# Patient Record
Sex: Female | Born: 1950 | Race: Black or African American | Hispanic: No | Marital: Married | Smoking: Never smoker
Health system: Southern US, Community
[De-identification: ages and names within clinical notes are randomized; demographics above are authoritative.]

## PROBLEM LIST (undated history)

## (undated) DIAGNOSIS — E7800 Pure hypercholesterolemia, unspecified: Secondary | ICD-10-CM

## (undated) DIAGNOSIS — I10 Essential (primary) hypertension: Secondary | ICD-10-CM

## (undated) DIAGNOSIS — M8580 Other specified disorders of bone density and structure, unspecified site: Secondary | ICD-10-CM

## (undated) HISTORY — PX: NECK SURGERY: SHX720

## (undated) HISTORY — DX: Pure hypercholesterolemia, unspecified: E78.00

## (undated) HISTORY — DX: Essential (primary) hypertension: I10

## (undated) HISTORY — PX: ABDOMINAL HYSTERECTOMY: SHX81

## (undated) HISTORY — PX: KNEE ARTHROSCOPY: SUR90

## (undated) HISTORY — PX: DILATION AND CURETTAGE OF UTERUS: SHX78

## (undated) HISTORY — DX: Other specified disorders of bone density and structure, unspecified site: M85.80

---

## 1998-10-31 ENCOUNTER — Other Ambulatory Visit: Admission: RE | Admit: 1998-10-31 | Discharge: 1998-10-31 | Payer: Self-pay | Admitting: Gynecology

## 1999-09-04 ENCOUNTER — Inpatient Hospital Stay (HOSPITAL_COMMUNITY): Admission: RE | Admit: 1999-09-04 | Discharge: 1999-09-06 | Payer: Self-pay | Admitting: Neurosurgery

## 1999-09-04 ENCOUNTER — Encounter: Payer: Self-pay | Admitting: Neurosurgery

## 1999-10-02 ENCOUNTER — Encounter: Admission: RE | Admit: 1999-10-02 | Discharge: 1999-10-02 | Payer: Self-pay | Admitting: Neurosurgery

## 1999-10-02 ENCOUNTER — Encounter: Payer: Self-pay | Admitting: Neurosurgery

## 1999-11-13 ENCOUNTER — Ambulatory Visit (HOSPITAL_COMMUNITY): Admission: RE | Admit: 1999-11-13 | Discharge: 1999-11-13 | Payer: Self-pay | Admitting: Neurosurgery

## 1999-11-13 ENCOUNTER — Encounter: Payer: Self-pay | Admitting: Neurosurgery

## 1999-12-19 ENCOUNTER — Other Ambulatory Visit: Admission: RE | Admit: 1999-12-19 | Discharge: 1999-12-19 | Payer: Self-pay | Admitting: Gynecology

## 2001-02-26 ENCOUNTER — Other Ambulatory Visit: Admission: RE | Admit: 2001-02-26 | Discharge: 2001-02-26 | Payer: Self-pay | Admitting: Gynecology

## 2002-03-01 ENCOUNTER — Other Ambulatory Visit: Admission: RE | Admit: 2002-03-01 | Discharge: 2002-03-01 | Payer: Self-pay | Admitting: Gynecology

## 2002-10-31 ENCOUNTER — Ambulatory Visit (HOSPITAL_COMMUNITY): Admission: RE | Admit: 2002-10-31 | Discharge: 2002-10-31 | Payer: Self-pay | Admitting: Gastroenterology

## 2003-05-01 ENCOUNTER — Other Ambulatory Visit: Admission: RE | Admit: 2003-05-01 | Discharge: 2003-05-01 | Payer: Self-pay | Admitting: Gynecology

## 2004-06-21 ENCOUNTER — Other Ambulatory Visit: Admission: RE | Admit: 2004-06-21 | Discharge: 2004-06-21 | Payer: Self-pay | Admitting: Gynecology

## 2005-06-24 ENCOUNTER — Other Ambulatory Visit: Admission: RE | Admit: 2005-06-24 | Discharge: 2005-06-24 | Payer: Self-pay | Admitting: Gynecology

## 2006-06-26 ENCOUNTER — Other Ambulatory Visit: Admission: RE | Admit: 2006-06-26 | Discharge: 2006-06-26 | Payer: Self-pay | Admitting: Gynecology

## 2007-07-13 ENCOUNTER — Other Ambulatory Visit: Admission: RE | Admit: 2007-07-13 | Discharge: 2007-07-13 | Payer: Self-pay | Admitting: Gynecology

## 2007-08-03 ENCOUNTER — Encounter: Admission: RE | Admit: 2007-08-03 | Discharge: 2007-08-03 | Payer: Self-pay | Admitting: Family Medicine

## 2007-12-27 ENCOUNTER — Encounter: Admission: RE | Admit: 2007-12-27 | Discharge: 2007-12-27 | Payer: Self-pay | Admitting: Orthopedic Surgery

## 2008-01-06 ENCOUNTER — Ambulatory Visit (HOSPITAL_BASED_OUTPATIENT_CLINIC_OR_DEPARTMENT_OTHER): Admission: RE | Admit: 2008-01-06 | Discharge: 2008-01-06 | Payer: Self-pay | Admitting: Orthopedic Surgery

## 2008-07-21 ENCOUNTER — Other Ambulatory Visit: Admission: RE | Admit: 2008-07-21 | Discharge: 2008-07-21 | Payer: Self-pay | Admitting: Gynecology

## 2008-07-21 ENCOUNTER — Ambulatory Visit: Payer: Self-pay | Admitting: Gynecology

## 2008-07-21 ENCOUNTER — Encounter: Payer: Self-pay | Admitting: Gynecology

## 2009-07-10 ENCOUNTER — Ambulatory Visit: Payer: Self-pay | Admitting: Gynecology

## 2009-07-18 ENCOUNTER — Ambulatory Visit: Payer: Self-pay | Admitting: Gynecology

## 2009-07-25 ENCOUNTER — Ambulatory Visit: Payer: Self-pay | Admitting: Gynecology

## 2009-07-25 ENCOUNTER — Other Ambulatory Visit: Admission: RE | Admit: 2009-07-25 | Discharge: 2009-07-25 | Payer: Self-pay | Admitting: Gynecology

## 2009-08-06 ENCOUNTER — Ambulatory Visit: Payer: Self-pay | Admitting: Gynecology

## 2009-10-19 ENCOUNTER — Ambulatory Visit: Payer: Self-pay | Admitting: Gynecology

## 2010-02-19 ENCOUNTER — Ambulatory Visit: Payer: Self-pay | Admitting: Gynecology

## 2010-06-19 ENCOUNTER — Ambulatory Visit
Admission: RE | Admit: 2010-06-19 | Discharge: 2010-06-19 | Payer: Self-pay | Source: Home / Self Care | Attending: Orthopedic Surgery | Admitting: Orthopedic Surgery

## 2010-07-26 ENCOUNTER — Encounter: Payer: Self-pay | Admitting: Gynecology

## 2010-08-22 ENCOUNTER — Encounter (INDEPENDENT_AMBULATORY_CARE_PROVIDER_SITE_OTHER): Payer: 59 | Admitting: Gynecology

## 2010-08-22 ENCOUNTER — Other Ambulatory Visit (HOSPITAL_COMMUNITY)
Admission: RE | Admit: 2010-08-22 | Discharge: 2010-08-22 | Disposition: A | Discharge status: Home / Self Care | Payer: 59 | Source: Ambulatory Visit | Attending: Gynecology | Admitting: Gynecology

## 2010-08-22 ENCOUNTER — Other Ambulatory Visit: Payer: Self-pay | Admitting: Gynecology

## 2010-08-22 DIAGNOSIS — M810 Age-related osteoporosis without current pathological fracture: Secondary | ICD-10-CM

## 2010-08-22 DIAGNOSIS — Z124 Encounter for screening for malignant neoplasm of cervix: Secondary | ICD-10-CM | POA: Insufficient documentation

## 2010-08-22 DIAGNOSIS — Z01419 Encounter for gynecological examination (general) (routine) without abnormal findings: Secondary | ICD-10-CM

## 2010-08-27 ENCOUNTER — Encounter (INDEPENDENT_AMBULATORY_CARE_PROVIDER_SITE_OTHER): Payer: 59

## 2010-08-27 DIAGNOSIS — Z1211 Encounter for screening for malignant neoplasm of colon: Secondary | ICD-10-CM

## 2010-08-27 DIAGNOSIS — M810 Age-related osteoporosis without current pathological fracture: Secondary | ICD-10-CM

## 2010-09-02 LAB — POCT I-STAT 4, (NA,K, GLUC, HGB,HCT)
Glucose, Bld: 83 mg/dL (ref 70–99)
HCT: 46 % (ref 36.0–46.0)
Potassium: 3.6 mEq/L (ref 3.5–5.1)

## 2010-11-05 NOTE — Op Note (Signed)
Janet Shelton, Janet Shelton NO.:  [id number]   MEDICAL RECORD NO.:  [id number]          PATIENT TYPE:  AMB   LOCATION:  DSC                          FACILITY:  [facility]   PHYSICIAN:  Rodney A. Mortenson, M.D.DATE OF BIRTH:  Oct 12, 1950   DATE OF PROCEDURE:  01/06/2008  DATE OF DISCHARGE:                               OPERATIVE REPORT   REFERRING PHYSICIAN:  Jethro Bastos, MD   JUSTIFICATION:  This is a 60 year old female complained of left knee  pain since February 2009.  Referred here by Dr. Marny Lowenstein.  No  injury or pain felt to the anterior aspect of the knee.  Getting down  the stairs is quite painful.  There is a small effusion.  No significant  medial and lateral joint line tenderness.  An MRI of the knee was done  and this shows extensive undersurface tearing of the posterior horn of  the medial meniscus and posterior medial horn of the medial meniscus  with a small free edge fragment and suspected free edge tear of the  midportion of the lateral meniscus.  Because of persistent pain and  discomfort, failing conservative care, the patient now admitted for  arthroscopic evaluation and treatment.  Questions were answered and  encouraged preoperatively.  Complications discussed.   JUSTIFICATION OUTPATIENT SURGICAL:  Minimal morbidity.   PREOPERATIVE DIAGNOSES:  Tear of posterior horn medial meniscal of left  knee; tear leading edge lateral meniscus left knee.   POSTOPERATIVE DIAGNOSES:  Complex tear posterior horn of the medial  meniscus of left knee; leading edge tear lateral meniscus anterior-third  and mid-third lateral meniscus left knee.   OPERATION:  Partial medial and lateral meniscectomy, left knee.   SURGEON:  Lenard Galloway. Chaney Malling, MD   ANESTHESIA:  MAC.   PATHOLOGY:  With the arthroscope, a very careful examination was  undertaken.  The articular cartilage and posterior aspect of the patella  appeared absent now.  In the femoral trochlear  area, there was a fissure  where cartilage was torn off.  The anterior cruciate ligament was  absolutely normal.  With the arthroscope in the lateral compartment,  there was tearing of the leading edge of the lateral meniscus over the  anterior-third, mid-third, and the posterior-third was normal.  No  articular cartilage loss off the femoral condyle or tibial plateau.  In  the medial compartment, there was an extensive undersurface of the  posterior horn of the medial meniscus and this was a complex tear of the  various fissure lines in different directions.  No damage to the  articular cartilage over the femoral condyle or tibial plateau was seen.   PROCEDURE:  The patient was placed on the operating table in supine  position with the pneumatic tourniquet about the left upper thigh.  The  entire left lower extremity was prepped with DuraPrep and draped down in  the usual manner.  An infusion can was placed in the superior medial  pouch and the knee distended with saline.  Anteromedial and  anterolateral portals were made.  The arthroscope was introduced.  The  findings were  as described above.  Attention first turned to the lateral  compartment.  Using the intra-articular shaver, the leading edge of the  lateral meniscus over anterior-third and mid-third was debrided back.  A  majority of the width of the meniscus was preserved and the frayed and  torn portions were debrided very nicely.  Once this was accomplished to  my satisfaction, the arthroscope was then placed in the medial  compartment.  The tear of the posterior horn of the medial meniscus was  much more complicated and required debridement through both medial and  lateral portals with series of various size baskets.  Once this was  debulked, intra-articular shaver was introduced.  The remaining rim was  then smoothed and balanced with nice transition of the mid-third of the  medial meniscus.  All debris was removed.  Excellent  decompression was  achieved in this area.  Knee was then filled with Marcaine.  A large  bulky pressure dressing applied.  The patient returned to recovery room  in excellent condition.  Technically, this went extremely well.   DISPOSITION:  1. To my office on Wednesday.  2. Usual postoperative instructions were given.  3. Percocet for pain.      Rodney A. Chaney Malling, M.D.  Electronically Signed     RAM/MEDQ  D:  01/06/2008  T:  01/06/2008  Job:  [id number]

## 2010-11-08 NOTE — Op Note (Signed)
[place]. [facility]  Patient:    Janet Shelton, Janet Shelton                      MRN: [id number] Proc. Date: 09/04/99 Adm. Date:  [id number] Attending:  Jackelyn Knife                           Operative Report  PREOPERATIVE DIAGNOSIS:  Herniated cervical disk C6-C7.  POSTOPERATIVE DIAGNOSIS:  Herniated cervical disk C6-C7.  OPERATION PERFORMED:  Anterior cervical diskectomy and fusion C6-C7.  SURGEON:  Izell Swain. Elesa Hacker, M.D.  ASSISTANT:  Clydene Fake, M.D.  INDICATIONS:  The patient is a 60 year old female who has been followed since February because of neck and right upper extremity pain. Conservative measures ere tried, but these did not help her pain. She had a significant right triceps weakness. An MRI study demonstrated a rather large C6-C7 disk herniation central on the right side. Because of a lack of improvement with conservative measures, she was admitted for the procedure described below.  PROCEDURE:  Under general endotracheal anesthesia, this lady was positioned supine on the operating table with the head in halter traction. The anterior portion of the neck was prepped, the patient draped in the usual manner. A preliminary cross table lateral film was made with a metallic marker in place to better localize he ideal position for the incision. After this was done, an incision was made on the left side of the neck extending from the midline over obliquely to the anterior  border of the sternocleidomastoid. Dissection was carried down through subcutaneous tissue to a rather thin platysmal layer which was also incised. Strap muscles were identified. Using primarily blunt dissection, the anterior vertebral body and disk margins were identified. A second cross table lateral film was taken with a metallic marker in the intervertebral disk space which shall prove to be the C6-C7 space. After positive identification of the proper operative  level, several retainer retractors were put into place as were intervertebral disk space spreaders. The soft disk material was removed all of the way down to the posterior longitudinal ligament and on the right side, as expected, herniated disk fragments were identified and also removed. The posterior longitudinal ligament was excised and its extent and care was taken to be sure that the nerve root on the right at this level was decompressed. The wound was copiously irrigated. A bone graft was formed from allograft iliac crest and shaped and then put into the intervertebral disk space. The traction was removed and the intervertebral disk space spreader  likewise was released. A 24-mm Blackstone anterior cervical plate was then put nto position and implanted with four 14 mm screws. A final cross table lateral x-ray was taken to assure proper location of the bone graft, screws, and plate.  The wound was copiously irrigated with antibiotic solution. Bleeding points were electrocoagulated and the wound was then closed in layers. A sterile dressing was applied and a soft cervical collar put into place. The patient was to taken to he recovery room in good condition. DD:  09/04/99 TD:  09/04/99 Job: [id number]

## 2010-11-08 NOTE — Discharge Summary (Signed)
[place]. [facility]  Patient:    Janet Shelton, Janet Shelton                      MRN: [id number] Adm. Date:  [id number] Disc. Date: [id number] Attending:  Jackelyn Knife                           Discharge Summary  SUMMARY:  Ms. Janet Shelton is a generally healthy 60 year old female, who presented with a several-month history of neck and right upper extremity pain.  This worsened in spite of conservative therapy.  A cervical MRI demonstrated a herniated intervertebral disk at C6-C7 on the right side.  After an appropriate preoperative evaluation as well as an explanation of the goals and risks involved, she was taken to the operating room on September 04, 1999.  She  underwent an anterior cervical diskectomy and fusion at C6-C7 using allograft and also application of an anterior cervical plate.  She tolerated the procedure well, but her hospitalization was prolonged by a day because of postoperative nausea.  She went on to be discharged on September 06, 1999, up and about and doing well. Her incision was healing well.  FINAL DIAGNOSES:  1. Herniated cervical disk, C6-C7.  2. Postoperative nausea.  CONDITION ON DISCHARGE:  Improving.  INSTRUCTIONS TO PATIENT:  1. Up ad lib with a 10-pound weight lifting restriction for four weeks.  She is to     wear a soft cervical collar for four weeks.  2. Regular diet.  3. Back to see me in six days.  4. Discharge medications:  Ibuprofen. DD:  09/06/99 TD:  09/06/99 Job: [id number]

## 2010-11-08 NOTE — Op Note (Signed)
   NAME:  Janet Shelton, Janet Shelton                         ACCOUNT NO.:  [id number]   MEDICAL RECORD NO.:  [id number]                   PATIENT TYPE:  AMB   LOCATION:  ENDO                                 FACILITY:  [facility]   PHYSICIAN:  Anselmo Rod, M.D.               DATE OF BIRTH:  1951-02-28   DATE OF PROCEDURE:  10/31/2002  DATE OF DISCHARGE:                                 OPERATIVE REPORT   PROCEDURE:  Colonoscopy.   ENDOSCOPIST:  Anselmo Rod, M.D.   INSTRUMENT USED:  Olympus video colonoscope.   INDICATION FOR PROCEDURE:  Change in bowel habits in a 60 year old African-  American female.  Rule out colonic polyps, masses, etc.   PREPROCEDURE PREPARATION:  Informed consent was procured from the patient.  The patient had fasted for eight hours prior to the procedure and prepped  with a bottle of magnesium citrate and a gallon of GoLYTELY the night prior  to the procedure.   PREPROCEDURE PHYSICAL:  VITAL SIGNS:  The patient had stable vital signs.  NECK:  Supple.  CHEST:  Clear to auscultation.  S1, S2 regular.  ABDOMEN:  Soft with normal bowel sounds.   DESCRIPTION OF PROCEDURE:  The patient was placed in the left lateral  decubitus position and sedated with 60 mg of Demerol and 6 mg of Versed  intravenously.  Once the patient was adequately sedate and maintained on low-  flow oxygen and continuous cardiac monitoring, the Olympus video colonoscope  was advanced from the rectum to the cecum without difficulty.  There was  some residual stool in the colon, and multiple washes were done.  Small  lesions could have been missed.  No masses, polyps, erosions, ulcerations,  or diverticula were seen.  Retroflexion in the rectum revealed no  abnormalities.   IMPRESSION:  Normal colonoscopy.  No masses or polyps seen.    RECOMMENDATIONS:  1. A high-fiber diet with 20-25 g of fiber in the diet and liberal fluid     intake has been recommended.  2. Outpatient follow-up in the next  two weeks for further recommendations.                                               Anselmo Rod, M.D.    JNM/MEDQ  D:  10/31/2002  T:  11/01/2002  Job:  [id number]   cc:   Gaetano Hawthorne. Lily Peer, M.D.  [address]  [place]  [place] [address]  Fax: [phone]   Jethro Bastos, M.D.  [address]  [place]  [place] [address]  Fax: [phone]

## 2010-11-08 NOTE — H&P (Signed)
[place]. [facility]  Patient:    Janet Shelton, Janet Shelton.                     MRN: Adm. Date:  09/04/99 Attending:  Izell Rochelle. Elesa Hacker, M.D.                         History and Physical  HISTORY:  Ms. Brailee Riede is a 60 year old female who was seen initially in mid-February 2001.  She was seen through the courtesy of Dr. Thereasa Distance A. Mortenson.  Mrs. Wilbanks developed neck and right upper extremity pain about a month prior to the February 12th visit.  Pain radiated from the neck, across the arm and shoulder, on down into the distal portion of the extremity.  She does not recall any injury or unusual activity that tended to cause this problem.  After the onset of her pain, she had two weeks of physical therapy, along with analgesics and anti-inflammatories but did not get significant benefit from this; she also tried a TENS unit along with a heating pad and had some minimal benefit from these measures.  She was given a trial of cortisone injection to the right shoulder area and to the upper thoracic and lower cervical musculature but this did not provide significant relief.  An MRI study was done and this showed a disc herniation at C6-C7 on the right side, consistent with her symptoms.  She has had no trouble with her left arm, nor has she had involvement of her legs or bowel and bladder.  PAST MEDICAL HISTORY:  She has had some high blood pressure.  She has had no problems with stroke or diabetes.  FAMILY HISTORY:  Her mother is living at age 41 and in good health.  Her father  died in his late 34s with cirrhosis of the liver.  She has one brother in his 7s, living and well, and two sisters, ages 49 and 72, living and well.  She has no children.  PREVIOUS SURGERY:  Her previous surgery consists of a hysterectomy in 1997.  CURRENT MEDICATIONS:  Maxzide, Baycol, Premarin, Celebrex and Vicodin.  ALLERGIES:  She is allergic to [facility].  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  She does not smoke or use alcohol.  She is married.  REVIEW OF SYSTEMS:  Review of systems is negative, except as already noted in the history of the present illness.  PHYSICAL EXAMINATION:  GENERAL:  On examination, she is alert and quite pleasant.  She weighs 165 pounds and is 5 feet 5 inches tall.  VITAL SIGNS:  Blood pressure is 146/98 with a pulse of 109 and normal temperature.  HEENT:  Examination is normal.  NECK:  She has no cervical adenopathy or mass lesions, nor does she have cervical bruits.  CHEST:  Clear bilaterally.  CARDIAC:  No murmurs.  ABDOMEN:  Soft and nontender, with no palpable masses.  NEUROLOGIC:  Examination reveals a limited range of motion of her cervical spine, particularly with rotation of the head and neck to the right side, which increases her neck and arm pain.  A motor examination reveals significant weakness of the  right triceps, with excellent strength elsewhere.  Deep tendon reflexes are roughly symmetrical and this includes the right triceps reflex at this point.  She has ome minimal hypesthesia over the index finger on the right.  Gait and coordination  re normal and deep tendon reflexes in the lower extremities are normal.  IMPRESSION:  Herniated cervical disc at C6-C7 on the right side.  Mrs. Mehlman was seen back in the office on March 5th and a careful explanation f the proposed anterior cervical diskectomy and fusion with plating and allograft was carried out.  She understands the risks include bleeding, infection, damage to carotid artery, potential damage to the esophagus or trachea, potential damage o vertebral artery, spinal cord or nerve root.  She understands that damage to neural structures can produce weakness, paralysis and sensory loss.  She understands damage to the arteries could produce a stroke.  We will proceed later  on today with an anterior decompression and fusion.DD: 09/04/99 TD:  09/04/99 Job: [id number]

## 2011-02-19 ENCOUNTER — Other Ambulatory Visit: Payer: Self-pay | Admitting: *Deleted

## 2011-02-20 ENCOUNTER — Telehealth: Payer: Self-pay | Admitting: *Deleted

## 2011-02-20 MED ORDER — DENOSUMAB 60 MG/ML ~~LOC~~ SOLN
60.0000 mg | Freq: Once | SUBCUTANEOUS | Status: AC
  Administered 2011-02-26: 60 mg via SUBCUTANEOUS

## 2011-02-20 NOTE — Telephone Encounter (Signed)
Patient informed Prolia here $25 copay will schedule for next week.

## 2011-02-26 ENCOUNTER — Ambulatory Visit (INDEPENDENT_AMBULATORY_CARE_PROVIDER_SITE_OTHER): Payer: 59 | Admitting: Anesthesiology

## 2011-02-26 DIAGNOSIS — M810 Age-related osteoporosis without current pathological fracture: Secondary | ICD-10-CM

## 2011-03-20 LAB — BASIC METABOLIC PANEL
CO2: 26
Calcium: 9.5
Creatinine, Ser: 0.73
GFR calc Af Amer: >60
GFR calc non Af Amer: >60
Sodium: 140

## 2011-08-11 ENCOUNTER — Encounter: Payer: Self-pay | Admitting: Gynecology

## 2011-08-26 ENCOUNTER — Encounter: Payer: PRIVATE HEALTH INSURANCE | Admitting: Gynecology

## 2011-09-02 ENCOUNTER — Telehealth: Payer: Self-pay | Admitting: *Deleted

## 2011-09-02 NOTE — Telephone Encounter (Signed)
Lm for patient to call.  Checking again with patient on insurance information.  Both insurances claiming to be primary.  Could not check benefits on Prolia.

## 2011-09-03 NOTE — Telephone Encounter (Signed)
Patient said insurance still working on it, will let me know when ready to reprocess.

## 2011-09-16 NOTE — Telephone Encounter (Signed)
Insurance corrected, called prolia and they will reprocess.

## 2011-09-25 ENCOUNTER — Ambulatory Visit (INDEPENDENT_AMBULATORY_CARE_PROVIDER_SITE_OTHER): Payer: PRIVATE HEALTH INSURANCE | Admitting: Gynecology

## 2011-09-25 ENCOUNTER — Encounter: Payer: Self-pay | Admitting: Gynecology

## 2011-09-25 VITALS — BP 120/86 | Ht 63.5 in | Wt 170.0 lb

## 2011-09-25 DIAGNOSIS — I10 Essential (primary) hypertension: Secondary | ICD-10-CM

## 2011-09-25 DIAGNOSIS — M810 Age-related osteoporosis without current pathological fracture: Secondary | ICD-10-CM

## 2011-09-25 DIAGNOSIS — E7800 Pure hypercholesterolemia, unspecified: Secondary | ICD-10-CM

## 2011-09-25 DIAGNOSIS — Z01419 Encounter for gynecological examination (general) (routine) without abnormal findings: Secondary | ICD-10-CM

## 2011-09-25 DIAGNOSIS — E559 Vitamin D deficiency, unspecified: Secondary | ICD-10-CM

## 2011-09-25 LAB — CREATININE, SERUM: Creat: 0.82 mg/dL (ref 0.50–1.10)

## 2011-09-25 LAB — CALCIUM: Calcium: 10.2 mg/dL (ref 8.4–10.5)

## 2011-09-25 LAB — BUN: BUN: 9 mg/dL (ref 6–23)

## 2011-09-25 NOTE — Patient Instructions (Signed)
Janet Shelton, I would like for you to start taking one Caltrate plus tablet daily along with your vitamin D 3 (Cholecalciferol) 2000 units. If you vitamin D level as low on today's blood tests when we call you he will need to stop this vitamin D 3 daily and take the 1 to were going to prescribe for you like she had before that she will take weekly for 12 weeks to get her level back to normal before you go back to the daily lower dose. Continue to exercise 3 or 4 times a week. We'll have any check for your Prolia injection. Remember to schedule her colonoscopy as well and to send this and the female the fecal occult blood testing cards.

## 2011-09-25 NOTE — Progress Notes (Signed)
Janet Shelton January 05, 1951 [id number]   History:    61 y.o.  for annual exam with no complaints today. Review of her record indicated she has a prior history of total abdominal hysterectomy with bilateral salpingo-oophorectomy. She is on no hormone replacement therapy and is having vasomotor symptoms. She was diagnosed with osteoporosis in 2011 and was started on Prolia Q6 months and is due for her next injection in the next few weeks. She's been followed by the [facility] family medicine group who is been monitoring her hypertension and hypercholesterolemia. Patient has had history vitamin D deficiency in the past and has not been taking her calcium and vitamin D on regular basis. Her last colonoscopy was 10 years ago. And her last mammogram was February this year which was normal.  Past medical history,surgical history, family history and social history were all reviewed and documented in the EPIC chart.  Gynecologic History No LMP recorded. Patient has had a hysterectomy. Contraception: none Last Pap: 2012. Results were: normal Last mammogram: 2013. Results were: normal  Obstetric History OB History    Grav Para Term Preterm Abortions TAB SAB Ect Mult Living   1 0        0     # Outc Date GA Lbr Len/2nd Wgt Sex Del Anes PTL Lv   1 GRA                ROS:  Was performed and pertinent positives and negatives are included in the history.  Exam: chaperone present  BP 120/86  Ht 5' 3.5" (1.613 m)  Wt 170 lb (77.111 kg)  BMI 29.64 kg/m2  Body mass index is 29.64 kg/(m^2).  General appearance : Well developed well nourished female. No acute distress HEENT: Neck supple, trachea midline, no carotid bruits, no thyroidmegaly Lungs: Clear to auscultation, no rhonchi or wheezes, or rib retractions  Heart: Regular rate and rhythm, no murmurs or gallops Breast:Examined in sitting and supine position were symmetrical in appearance, no palpable masses or tenderness,  no skin retraction, no nipple  inversion, no nipple discharge, no skin discoloration, no axillary or supraclavicular lymphadenopathy Abdomen: no palpable masses or tenderness, no rebound or guarding Extremities: no edema or skin discoloration or tenderness  Pelvic:  Bartholin, Urethra, Skene Glands: Within normal limits             Vagina: No gross lesions or discharge  Cervix: Absent  Uterus  absent  Adnexa  Without masses or tenderness  Anus and perineum  normal   Rectovaginal  normal sphincter tone without palpated masses or tenderness             Hemoccult fecal occult blood testing cards given to patient is made to the office for testing   Bone density study done last 08/27/2010 after one year of having been on Prolia indicated a 6.5% improvement on the AP spine T score of -3.5 the remainder of the regions of interest were normal range.  Assessment/Plan:  61 y.o. female for annual exam with history of osteoporosis has been on Prolia since February 2011. Next Prolia injection scheduled the next few weeks. She will stop by the lab we'll check her BUN and creatinine along with calcium and vitamin D level. We discussed the new screening guidelines her Pap smears she has never had abnormal Pap smears has had a hysterectomy so no Pap smears will be done from her on. She is exercising on a regular basis. We encouraged her to take her Caltrate plus  daily along with a vitamin D 3 cholecalciferol 2000 units daily. Fecal occult blood testing cards provided for her to submit to the office for testing. She was reminded to schedule colonoscopy which is due. And she was instructed to continue her monthly self breast examinations.    Ok Edwards MD, 12:32 PM 09/25/2011

## 2011-09-26 ENCOUNTER — Encounter: Payer: Self-pay | Admitting: *Deleted

## 2011-09-26 ENCOUNTER — Other Ambulatory Visit: Payer: Self-pay | Admitting: *Deleted

## 2011-09-30 ENCOUNTER — Other Ambulatory Visit: Payer: Self-pay | Admitting: *Deleted

## 2011-09-30 DIAGNOSIS — M810 Age-related osteoporosis without current pathological fracture: Secondary | ICD-10-CM

## 2011-09-30 MED ORDER — DENOSUMAB 60 MG/ML ~~LOC~~ SOLN
60.0000 mg | Freq: Once | SUBCUTANEOUS | Status: AC
  Administered 2011-10-06: 60 mg via SUBCUTANEOUS

## 2011-09-30 NOTE — Telephone Encounter (Signed)
Patient informed covered at 100% and have ordered Prolia and will come for injection

## 2011-10-06 ENCOUNTER — Telehealth: Payer: Self-pay | Admitting: *Deleted

## 2011-10-06 ENCOUNTER — Ambulatory Visit (INDEPENDENT_AMBULATORY_CARE_PROVIDER_SITE_OTHER): Payer: PRIVATE HEALTH INSURANCE

## 2011-10-06 DIAGNOSIS — M810 Age-related osteoporosis without current pathological fracture: Secondary | ICD-10-CM

## 2011-10-06 NOTE — Telephone Encounter (Signed)
Message copied by Aura Camps on Mon Oct 06, 2011 12:53 PM ------      Message from: Leanor Kail      Created: Mon Oct 06, 2011  9:56 AM                   Patient, had her labs done at last appointment on 09/25/11 & would like a call with the results.            [phone]

## 2011-10-06 NOTE — Telephone Encounter (Signed)
Pt informed with normal lab result on 09/25/11.

## 2012-03-24 ENCOUNTER — Telehealth: Payer: Self-pay | Admitting: *Deleted

## 2012-03-24 NOTE — Telephone Encounter (Signed)
Informed patient and she will call me back after the 14th to schedule appt. Benefits are same as earlier this year, pt pays nothing since has secondary insurance. KW

## 2012-03-24 NOTE — Telephone Encounter (Signed)
LM for pt regarding Prolia. KW

## 2012-04-13 NOTE — Telephone Encounter (Signed)
Lm for pt to call me back. KW 

## 2012-04-14 ENCOUNTER — Ambulatory Visit (INDEPENDENT_AMBULATORY_CARE_PROVIDER_SITE_OTHER): Payer: PRIVATE HEALTH INSURANCE | Admitting: *Deleted

## 2012-04-14 DIAGNOSIS — M810 Age-related osteoporosis without current pathological fracture: Secondary | ICD-10-CM

## 2012-04-14 MED ORDER — DENOSUMAB 60 MG/ML ~~LOC~~ SOLN
60.0000 mg | Freq: Once | SUBCUTANEOUS | Status: AC
  Administered 2012-04-14: 60 mg via SUBCUTANEOUS

## 2012-04-14 NOTE — Telephone Encounter (Signed)
Called and made an appt for Prolia for today. KW

## 2012-09-21 ENCOUNTER — Encounter: Payer: Self-pay | Admitting: Gynecology

## 2012-11-29 ENCOUNTER — Encounter: Payer: Self-pay | Admitting: Gynecology

## 2012-12-01 ENCOUNTER — Ambulatory Visit (INDEPENDENT_AMBULATORY_CARE_PROVIDER_SITE_OTHER): Payer: PRIVATE HEALTH INSURANCE | Admitting: Gynecology

## 2012-12-01 ENCOUNTER — Encounter: Payer: Self-pay | Admitting: Gynecology

## 2012-12-01 VITALS — BP 124/80 | Ht 64.5 in | Wt 175.0 lb

## 2012-12-01 DIAGNOSIS — Z8639 Personal history of other endocrine, nutritional and metabolic disease: Secondary | ICD-10-CM

## 2012-12-01 DIAGNOSIS — M810 Age-related osteoporosis without current pathological fracture: Secondary | ICD-10-CM

## 2012-12-01 DIAGNOSIS — R232 Flushing: Secondary | ICD-10-CM

## 2012-12-01 DIAGNOSIS — Z01419 Encounter for gynecological examination (general) (routine) without abnormal findings: Secondary | ICD-10-CM

## 2012-12-01 DIAGNOSIS — N951 Menopausal and female climacteric states: Secondary | ICD-10-CM

## 2012-12-01 LAB — CREATININE, SERUM: Creat: 1.12 mg/dL — ABNORMAL HIGH (ref 0.50–1.10)

## 2012-12-01 MED ORDER — PAROXETINE MESYLATE 7.5 MG PO CAPS: 7.5000 mg | ORAL_CAPSULE | Freq: Every day | ORAL | Status: DC

## 2012-12-01 NOTE — Patient Instructions (Signed)
Shingles Shingles (herpes zoster) is an infection that is caused by the same virus that causes chickenpox (varicella). The infection causes a painful skin rash and fluid-filled blisters, which eventually break open, crust over, and heal. It may occur in any area of the body, but it usually affects only one side of the body or face. The pain of shingles usually lasts about 1 month. However, some people with shingles may develop long-term (chronic) pain in the affected area of the body. Shingles often occurs many years after the person had chickenpox. It is more common:  In people older than 50 years.  In people with weakened immune systems, such as those with HIV, AIDS, or cancer.  In people taking medicines that weaken the immune system, such as transplant medicines.  In people under great stress. CAUSES  Shingles is caused by the varicella zoster virus (VZV), which also causes chickenpox. After a person is infected with the virus, it can remain in the person's body for years in an inactive state (dormant). To cause shingles, the virus reactivates and breaks out as an infection in a nerve root. The virus can be spread from person to person (contagious) through contact with open blisters of the shingles rash. It will only spread to people who have not had chickenpox. When these people are exposed to the virus, they may develop chickenpox. They will not develop shingles. Once the blisters scab over, the person is no longer contagious and cannot spread the virus to others. SYMPTOMS  Shingles shows up in stages. The initial symptoms may be pain, itching, and tingling in an area of the skin. This pain is usually described as burning, stabbing, or throbbing.In a few days or weeks, a painful red rash will appear in the area where the pain, itching, and tingling were felt. The rash is usually on one side of the body in a band or belt-like pattern. Then, the rash usually turns into fluid-filled blisters. They  will scab over and dry up in approximately 2 3 weeks. Flu-like symptoms may also occur with the initial symptoms, the rash, or the blisters. These may include:  Fever.  Chills.  Headache.  Upset stomach. DIAGNOSIS  Your caregiver will perform a skin exam to diagnose shingles. Skin scrapings or fluid samples may also be taken from the blisters. This sample will be examined under a microscope or sent to a lab for further testing. TREATMENT  There is no specific cure for shingles. Your caregiver will likely prescribe medicines to help you manage the pain, recover faster, and avoid long-term problems. This may include antiviral drugs, anti-inflammatory drugs, and pain medicines. HOME CARE INSTRUCTIONS   Take a cool bath or apply cool compresses to the area of the rash or blisters as directed. This may help with the pain and itching.   Only take over-the-counter or prescription medicines as directed by your caregiver.   Rest as directed by your caregiver.  Keep your rash and blisters clean with mild soap and cool water or as directed by your caregiver.  Do not pick your blisters or scratch your rash. Apply an anti-itch cream or numbing creams to the affected area as directed by your caregiver.  Keep your shingles rash covered with a loose bandage (dressing).  Avoid skin contact with:  Babies.   Pregnant women.   Children with eczema.   Elderly people with transplants.   People with chronic illnesses, such as leukemia or AIDS.   Wear loose-fitting clothing to help ease   the pain of material rubbing against the rash.  Keep all follow-up appointments with your caregiver.If the area involved is on your face, you may receive a referral for follow-up to a specialist, such as an eye doctor (ophthalmologist) or an ear, nose, and throat (ENT) doctor. Keeping all follow-up appointments will help you avoid eye complications, chronic pain, or disability.  SEEK IMMEDIATE MEDICAL  CARE IF:   You have facial pain, pain around the eye area, or loss of feeling on one side of your face.  You have ear pain or ringing in your ear.  You have loss of taste.  Your pain is not relieved with prescribed medicines.   Your redness or swelling spreads.   You have more pain and swelling.  Your condition is worsening or has changed.   You have a feveror persistent symptoms for more than 2 3 days.  You have a fever and your symptoms suddenly get worse. MAKE SURE YOU:  Understand these instructions.  Will watch your condition.  Will get help right away if you are not doing well or get worse. Document Released: 06/09/2005 Document Revised: 03/03/2012 Document Reviewed: 01/22/2012 [facility] Patient Information 2014 [place], [place]. Tetanus, Diphtheria, Pertussis (Tdap) Vaccine What You Need to Know WHY GET VACCINATED? Tetanus, diphtheria and pertussis can be very serious diseases, even for adolescents and adults. Tdap vaccine can protect [place] from these diseases. TETANUS (Lockjaw) causes painful muscle tightening and stiffness, usually all over the body.  It can lead to tightening of muscles in the head and neck so you can't open your mouth, swallow, or sometimes even breathe. Tetanus kills about 1 out of 5 people who are infected. DIPHTHERIA can cause a thick coating to form in the back of the throat.  It can lead to breathing problems, paralysis, heart failure, and death. PERTUSSIS (Whooping Cough) causes severe coughing spells, which can cause difficulty breathing, vomiting and disturbed sleep.  It can also lead to weight loss, incontinence, and rib fractures. Up to 2 in 100 adolescents and 5 in 100 adults with pertussis are hospitalized or have complications, which could include pneumonia and death. These diseases are caused by bacteria. Diphtheria and pertussis are spread from person to person through coughing or sneezing. Tetanus enters the body through cuts,  scratches, or wounds. Before vaccines, the [place] saw as many as 200,000 cases a year of diphtheria and pertussis, and hundreds of cases of tetanus. Since vaccination began, tetanus and diphtheria have dropped by about 99% and pertussis by about 80%. TDAP VACCINE Tdap vaccine can protect adolescents and adults from tetanus, diphtheria, and pertussis. One dose of Tdap is routinely given at age 64 or 32. People who did not get Tdap at that age should get it as soon as possible. Tdap is especially important for health care professionals and anyone having close contact with a baby younger than 12 months. Pregnant women should get a dose of Tdap during every pregnancy, to protect the newborn from pertussis. Infants are most at risk for severe, life-threatening complications from pertussis. A similar vaccine, called Td, protects from tetanus and diphtheria, but not pertussis. A Td booster should be given every 10 years. Tdap may be given as one of these boosters if you have not already gotten a dose. Tdap may also be given after a severe cut or burn to prevent tetanus infection. Your doctor can give you more information. Tdap may safely be given at the same time as other vaccines. SOME PEOPLE SHOULD NOT  GET THIS VACCINE  If you ever had a life-threatening allergic reaction after a dose of any tetanus, diphtheria, or pertussis containing vaccine, OR if you have a severe allergy to any part of this vaccine, you should not get Tdap. Tell your doctor if you have any severe allergies.  If you had a coma, or long or multiple seizures within 7 days after a childhood dose of DTP or DTaP, you should not get Tdap, unless a cause other than the vaccine was found. You can still get Td.  Talk to your doctor if you:  have epilepsy or another nervous system problem,  had severe pain or swelling after any vaccine containing diphtheria, tetanus or pertussis,  ever had Guillain-Barr Syndrome (GBS),  aren't  feeling well on the day the shot is scheduled. RISKS OF A VACCINE REACTION With any medicine, including vaccines, there is a chance of side effects. These are usually mild and go away on their own, but serious reactions are also possible. Brief fainting spells can follow a vaccination, leading to injuries from falling. Sitting or lying down for about 15 minutes can help prevent these. Tell your doctor if you feel dizzy or light-headed, or have vision changes or ringing in the ears. Mild problems following Tdap (Did not interfere with activities)  Pain where the shot was given (about 3 in 4 adolescents or 2 in 3 adults)  Redness or swelling where the shot was given (about 1 person in 5)  Mild fever of at least 100.84F (up to about 1 in 25 adolescents or 1 in 100 adults)  Headache (about 3 or 4 people in 10)  Tiredness (about 1 person in 3 or 4)  Nausea, vomiting, diarrhea, stomach ache (up to 1 in 4 adolescents or 1 in 10 adults)  Chills, body aches, sore joints, rash, swollen glands (uncommon) Moderate problems following Tdap (Interfered with activities, but did not require medical attention)  Pain where the shot was given (about 1 in 5 adolescents or 1 in 100 adults)  Redness or swelling where the shot was given (up to about 1 in 16 adolescents or 1 in 25 adults)  Fever over 102F (about 1 in 100 adolescents or 1 in 250 adults)  Headache (about 3 in 20 adolescents or 1 in 10 adults)  Nausea, vomiting, diarrhea, stomach ache (up to 1 or 3 people in 100)  Swelling of the entire arm where the shot was given (up to about 3 in 100). Severe problems following Tdap (Unable to perform usual activities, required medical attention)  Swelling, severe pain, bleeding and redness in the arm where the shot was given (rare). A severe allergic reaction could occur after any vaccine (estimated less than 1 in a million doses). WHAT IF THERE IS A SERIOUS REACTION? What should I look for?  Look  for anything that concerns you, such as signs of a severe allergic reaction, very high fever, or behavior changes. Signs of a severe allergic reaction can include hives, swelling of the face and throat, difficulty breathing, a fast heartbeat, dizziness, and weakness. These would start a few minutes to a few hours after the vaccination. What should I do?  If you think it is a severe allergic reaction or other emergency that can't wait, call 9-1-1 or get the person to the nearest hospital. Otherwise, call your doctor.  Afterward, the reaction should be reported to the "Vaccine Adverse Event Reporting System" (VAERS). Your doctor might file this report, or you can do it  yourself through the VAERS web site at www.vaers.LAgents.no, or by calling 1-417-036-3759. VAERS is only for reporting reactions. They do not give medical advice.  THE NATIONAL VACCINE INJURY COMPENSATION PROGRAM The National Vaccine Injury Compensation Program (VICP) is a federal program that was created to compensate people who may have been injured by certain vaccines. Persons who believe they may have been injured by a vaccine can learn about the program and about filing a claim by calling 1-(478)089-3607 or visiting the VICP website at SpiritualWord.at. HOW CAN I LEARN MORE?  Ask your doctor.  Call your local or state health department.  Contact the Centers for Disease Control and Prevention (CDC):  Call 224-256-6154 or visit CDC's website at PicCapture.uy. CDC Tdap Vaccine VIS (10/30/11) Document Released: 12/09/2011 Document Revised: 03/03/2012 Document Reviewed: 12/09/2011 [employer] Patient Information 2014 [place], [place].

## 2012-12-02 NOTE — Progress Notes (Signed)
Janet Shelton 02-Feb-1951 [id number]   History:    62 y.o.  for annual gyn who is complaining of vasomotor symptoms. Review of her record indicated she has a prior history of total abdominal hysterectomy with bilateral salpingo-oophorectomy. She was diagnosed with osteoporosis in 2011 and was started on Prolia Q6 months and is due for her next injection. She's been followed by the [facility] family medicine group who is been monitoring her hypertension and hypercholesterolemia. Patient has had history vitamin D deficiency in the past and has not been taking her calcium and vitamin D on regular basis . Her colonoscopy was in 2013 which was reported to be normal. Her last bone density study was in 2012 one year after having initiated Prolia was as follows: Patient has statistically improved her bone mineral density of her AP spine by 6.5% whose previous T score was -3.9 and was -3.5 in 2004. Total left hip was stable total and right hip there were significant improvement in her BMD of 6.7%.   Past medical history,surgical history, family history and social history were all reviewed and documented in the EPIC chart.  Gynecologic History No LMP recorded. Patient has had a hysterectomy. Contraception:postmenopausal Last Pap: 2012. Results were: normal Last mammogram: 2014. Results were: normal  Obstetric History OB History   Grav Para Term Preterm Abortions TAB SAB Ect Mult Living   1 0        0     # Outc Date GA Lbr Len/2nd Wgt Sex Del Anes PTL Lv   1 GRA                ROS: A ROS was performed and pertinent positives and negatives are included in the history.  GENERAL: No fevers or chills. HEENT: No change in vision, no earache, sore throat or sinus congestion. NECK: No pain or stiffness. CARDIOVASCULAR: No chest pain or pressure. No palpitations. PULMONARY: No shortness of breath, cough or wheeze. GASTROINTESTINAL: No abdominal pain, nausea, vomiting or diarrhea, melena or bright red blood per  rectum. GENITOURINARY: No urinary frequency, urgency, hesitancy or dysuria. MUSCULOSKELETAL: No joint or muscle pain, no back pain, no recent trauma. DERMATOLOGIC: No rash, no itching, no lesions. ENDOCRINE: No polyuria, polydipsia, no heat or cold intolerance. No recent change in weight. HEMATOLOGICAL: No anemia or easy bruising or bleeding. NEUROLOGIC: No headache, seizures, numbness, tingling or weakness. PSYCHIATRIC: No depression, no loss of interest in normal activity or change in sleep pattern.     Exam: chaperone present  BP 124/80  Ht 5' 4.5" (1.638 m)  Wt 175 lb (79.379 kg)  BMI 29.59 kg/m2  Body mass index is 29.59 kg/(m^2).  General appearance : Well developed well nourished female. No acute distress HEENT: Neck supple, trachea midline, no carotid bruits, no thyroidmegaly Lungs: Clear to auscultation, no rhonchi or wheezes, or rib retractions  Heart: Regular rate and rhythm, no murmurs or gallops Breast:Examined in sitting and supine position were symmetrical in appearance, no palpable masses or tenderness,  no skin retraction, no nipple inversion, no nipple discharge, no skin discoloration, no axillary or supraclavicular lymphadenopathy Abdomen: no palpable masses or tenderness, no rebound or guarding Extremities: no edema or skin discoloration or tenderness  Pelvic:  Bartholin, Urethra, Skene Glands: Within normal limits             Vagina: No gross lesions or discharge  Cervix: No gross lesions or discharge  Uterus  anteverted, normal size, shape and consistency, non-tender and mobile  Adnexa  Without masses or tenderness  Anus and perineum  normal   Rectovaginal  normal sphincter tone without palpated masses or tenderness             Hemoccult card provided   Patient will be offered the Tdap vaccine today and prescription for shingles vaccine will be provided as well.   Assessment/Plan:  62 y.o. female for annual exam who does not want to go on hormone replacement  therapy and is suffering from vasomotor symptoms postmenopausal. We discussed treating her with Brisdelle (Paroxitine) 7.5 mg at bedtime. Risks benefits and pros and cons were discussed. Samples were provided. Patient will be as scheduled to receive her overdue Prolia injection later this week. Following labs ordered today: BUN, serum creatinine, calcium, and vitamin D. She was reminded to do monthly self breast examination. She will also be scheduled for her bone density study as well. She was reminded to submit to the office and Hemoccult card for testing. We discussed the importance of regular exercise as well.    Ok Edwards MD, 7:50 AM 12/02/2012

## 2012-12-14 ENCOUNTER — Telehealth: Payer: Self-pay | Admitting: *Deleted

## 2012-12-14 NOTE — Telephone Encounter (Signed)
Pt informed benefits of Prolia. [employer] gave prior authorization through 12/20/13. Secondary plan will coordinate benefits with primary plan and will pick up remaining expenses. Pt will RTO tomorrow at 12 noon for Prolia injection. And will schedule Bone Density when she comes in. [place]

## 2012-12-15 ENCOUNTER — Encounter: Payer: Self-pay | Admitting: Gynecology

## 2012-12-15 ENCOUNTER — Ambulatory Visit (INDEPENDENT_AMBULATORY_CARE_PROVIDER_SITE_OTHER): Payer: PRIVATE HEALTH INSURANCE | Admitting: Anesthesiology

## 2012-12-15 DIAGNOSIS — M810 Age-related osteoporosis without current pathological fracture: Secondary | ICD-10-CM

## 2012-12-15 MED ORDER — DENOSUMAB 60 MG/ML ~~LOC~~ SOLN
60.0000 mg | Freq: Once | SUBCUTANEOUS | Status: AC
  Administered 2012-12-15: 60 mg via SUBCUTANEOUS

## 2012-12-16 ENCOUNTER — Encounter: Payer: Self-pay | Admitting: Gynecology

## 2013-01-13 ENCOUNTER — Ambulatory Visit (INDEPENDENT_AMBULATORY_CARE_PROVIDER_SITE_OTHER): Payer: PRIVATE HEALTH INSURANCE

## 2013-01-13 DIAGNOSIS — M810 Age-related osteoporosis without current pathological fracture: Secondary | ICD-10-CM

## 2013-06-29 ENCOUNTER — Telehealth: Payer: Self-pay | Admitting: *Deleted

## 2013-06-29 NOTE — Telephone Encounter (Signed)
Time for next Prolia injection. pts primary insurance requires PA obtained good til December 20, 2013. And pts secondary insurance picks up what primary insurance doesn't cover up to 100%. LM for pt to call back KW

## 2013-06-30 NOTE — Telephone Encounter (Signed)
Prolia not needed due to most recent bone density results. Follow up bone density as advised. KW

## 2013-09-15 HISTORY — PX: SHOULDER SURGERY: SHX246

## 2013-12-27 ENCOUNTER — Encounter: Payer: PRIVATE HEALTH INSURANCE | Admitting: Gynecology

## 2013-12-30 ENCOUNTER — Encounter: Payer: Self-pay | Admitting: Gynecology

## 2014-01-13 ENCOUNTER — Ambulatory Visit (INDEPENDENT_AMBULATORY_CARE_PROVIDER_SITE_OTHER): Payer: 59 | Admitting: Gynecology

## 2014-01-13 ENCOUNTER — Encounter: Payer: Self-pay | Admitting: Gynecology

## 2014-01-13 VITALS — BP 128/86 | Ht 64.5 in | Wt 179.0 lb

## 2014-01-13 DIAGNOSIS — Z01419 Encounter for gynecological examination (general) (routine) without abnormal findings: Secondary | ICD-10-CM

## 2014-01-13 DIAGNOSIS — Z79890 Hormone replacement therapy (postmenopausal): Secondary | ICD-10-CM

## 2014-01-13 DIAGNOSIS — N952 Postmenopausal atrophic vaginitis: Secondary | ICD-10-CM

## 2014-01-13 MED ORDER — ESTRADIOL 10 MCG VA TABS: 1.0000 | ORAL_TABLET | VAGINAL | Status: DC

## 2014-01-13 NOTE — Patient Instructions (Signed)

## 2014-01-13 NOTE — Progress Notes (Signed)
Janet Shelton 11-09-1950 [id number]   History:    63 y.o.  for annual gyn exam with no complaints today with the exception of vaginal dryness. Patient with past history of osteoporosis was on Prolia for 3 years and her last bone density study in 2014 was normal. The medication was discontinued. She continues her calcium and vitamin D. Patient with past history of total abdominal hysterectomy with bilateral salpingo-oophorectomy.She's been followed by the [facility] family medicine group who is been monitoring her hypertension and hypercholesterolemia. Patient has had history vitamin D deficiency in the past and has not been taking her calcium and vitamin D on regular basis . Her colonoscopy was in 2013 which was reported to be normal. Her PCP is currently treating her for vitamin D deficiency. The patient's shingle vaccine and Tdap vaccine are up-to-date. Patient with no previous history of abnormal Pap smear last Pap smear was in 2012.   Past medical history,surgical history, family history and social history were all reviewed and documented in the EPIC chart.  Gynecologic History No LMP recorded. Patient has had a hysterectomy. Contraception: status post hysterectomy Last Pap: 2012. Results were: normal Last mammogram: 2015. Results were: normal  Obstetric History OB History  Gravida Para Term Preterm AB SAB TAB Ectopic Multiple Living  1 0        0    # Outcome Date GA Lbr Len/2nd Weight Sex Delivery Anes PTL Lv  1 GRA                ROS: A ROS was performed and pertinent positives and negatives are included in the history.  GENERAL: No fevers or chills. HEENT: No change in vision, no earache, sore throat or sinus congestion. NECK: No pain or stiffness. CARDIOVASCULAR: No chest pain or pressure. No palpitations. PULMONARY: No shortness of breath, cough or wheeze. GASTROINTESTINAL: No abdominal pain, nausea, vomiting or diarrhea, melena or bright red blood per rectum. GENITOURINARY: No  urinary frequency, urgency, hesitancy or dysuria. MUSCULOSKELETAL: No joint or muscle pain, no back pain, no recent trauma. DERMATOLOGIC: No rash, no itching, no lesions. ENDOCRINE: No polyuria, polydipsia, no heat or cold intolerance. No recent change in weight. HEMATOLOGICAL: No anemia or easy bruising or bleeding. NEUROLOGIC: No headache, seizures, numbness, tingling or weakness. PSYCHIATRIC: No depression, no loss of interest in normal activity or change in sleep pattern.     Exam: chaperone present  BP 128/86  Ht 5' 4.5" (1.638 m)  Wt 179 lb (81.194 kg)  BMI 30.26 kg/m2  Body mass index is 30.26 kg/(m^2).  General appearance : Well developed well nourished female. No acute distress HEENT: Neck supple, trachea midline, no carotid bruits, no thyroidmegaly Lungs: Clear to auscultation, no rhonchi or wheezes, or rib retractions  Heart: Regular rate and rhythm, no murmurs or gallops Breast:Examined in sitting and supine position were symmetrical in appearance, no palpable masses or tenderness,  no skin retraction, no nipple inversion, no nipple discharge, no skin discoloration, no axillary or supraclavicular lymphadenopathy Abdomen: no palpable masses or tenderness, no rebound or guarding Extremities: no edema or skin discoloration or tenderness  Pelvic:  Bartholin, Urethra, Skene Glands: Within normal limits             Vagina: No gross lesions or discharge  Cervix: Absent               Uterus: Absent         Adnexa  Without masses or tenderness  Anus and perineum  normal   Rectovaginal  normal sphincter tone without palpated masses or tenderness             Hemoccult card provided     Assessment/Plan:  63 y.o. female for annual exam with postmenopausal vaginal atrophy  will be started on Vagifem 10 mcg tablet to apply twice a week. Risks benefits and pros and cons were discussed. Pap smear not done in accordance to the new guidelines. PCP is doing her blood work. She was reminded  of followup for vitamin D level checks and she recently was started on treatment for vitamin D deficiency. We discussed and partial weight-bearing exercise in addition to the calcium and vitamin D for osteoporosis prevention. She will need a bone density study next year. We discussed importance of monthly breast exams.  Note: This dictation was prepared with  Dragon/digital dictation along withSmart phrase technology. Any transcriptional errors that result from this process are unintentional.   Terrance Mass MD, 1:14 PM 01/13/2014

## 2014-09-04 ENCOUNTER — Other Ambulatory Visit: Payer: Self-pay | Admitting: Family Medicine

## 2014-09-04 ENCOUNTER — Ambulatory Visit
Admission: RE | Admit: 2014-09-04 | Discharge: 2014-09-04 | Disposition: A | Discharge status: Home / Self Care | Payer: 59 | Source: Ambulatory Visit | Attending: Family Medicine | Admitting: Family Medicine

## 2014-09-04 DIAGNOSIS — M545 Low back pain: Secondary | ICD-10-CM

## 2014-12-27 ENCOUNTER — Encounter: Payer: Self-pay | Admitting: Gynecology

## 2015-02-28 ENCOUNTER — Encounter: Payer: Federal, State, Local not specified - PPO | Admitting: Gynecology

## 2015-03-23 ENCOUNTER — Encounter: Payer: Federal, State, Local not specified - PPO | Admitting: Gynecology

## 2015-04-03 ENCOUNTER — Encounter: Payer: Federal, State, Local not specified - PPO | Admitting: Gynecology

## 2015-05-02 ENCOUNTER — Encounter: Payer: Federal, State, Local not specified - PPO | Admitting: Gynecology

## 2015-05-16 ENCOUNTER — Encounter: Payer: Federal, State, Local not specified - PPO | Admitting: Gynecology

## 2015-05-31 ENCOUNTER — Encounter: Payer: Self-pay | Admitting: Gynecology

## 2015-05-31 ENCOUNTER — Ambulatory Visit (INDEPENDENT_AMBULATORY_CARE_PROVIDER_SITE_OTHER): Payer: Federal, State, Local not specified - PPO | Admitting: Gynecology

## 2015-05-31 VITALS — BP 124/88 | Ht 64.5 in | Wt 171.0 lb

## 2015-05-31 DIAGNOSIS — Z8639 Personal history of other endocrine, nutritional and metabolic disease: Secondary | ICD-10-CM | POA: Diagnosis not present

## 2015-05-31 DIAGNOSIS — Z23 Encounter for immunization: Secondary | ICD-10-CM | POA: Diagnosis not present

## 2015-05-31 DIAGNOSIS — Z01419 Encounter for gynecological examination (general) (routine) without abnormal findings: Secondary | ICD-10-CM

## 2015-05-31 DIAGNOSIS — Z780 Asymptomatic menopausal state: Secondary | ICD-10-CM

## 2015-05-31 NOTE — Progress Notes (Signed)
Janet Shelton 1951-04-28 [id number]   History:    64 y.o.  for annual gyn exam with no complaints today.Patient with past history of osteoporosis was on Prolia for 3 years and her last bone density study in 2014 was normal. The medication was discontinued. She reports to me that she has not been keeping up with her calcium and vitamin D. She does have history of vitamin D deficiency in the past and treated. Her colonoscopy was in 2013 reported been normal. Patient with prior to her hysterectomy and after has had no history of abnormal Pap smear. Patient requesting flu vaccine today. Her Tdap vaccine is up-to-date.   Past medical history,surgical history, family history and social history were all reviewed and documented in the EPIC chart.  Gynecologic History No LMP recorded. Patient has had a hysterectomy. Contraception: status post hysterectomy Last Pap: 2012. Results were: normal Last mammogram: 2016. Results were: normal  Obstetric History OB History  Gravida Para Term Preterm AB SAB TAB Ectopic Multiple Living  1 0        0    # Outcome Date GA Lbr Len/2nd Weight Sex Delivery Anes PTL Lv  1 Gravida                ROS: A ROS was performed and pertinent positives and negatives are included in the history.  GENERAL: No fevers or chills. HEENT: No change in vision, no earache, sore throat or sinus congestion. NECK: No pain or stiffness. CARDIOVASCULAR: No chest pain or pressure. No palpitations. PULMONARY: No shortness of breath, cough or wheeze. GASTROINTESTINAL: No abdominal pain, nausea, vomiting or diarrhea, melena or bright red blood per rectum. GENITOURINARY: No urinary frequency, urgency, hesitancy or dysuria. MUSCULOSKELETAL: No joint or muscle pain, no back pain, no recent trauma. DERMATOLOGIC: No rash, no itching, no lesions. ENDOCRINE: No polyuria, polydipsia, no heat or cold intolerance. No recent change in weight. HEMATOLOGICAL: No anemia or easy bruising or bleeding.  NEUROLOGIC: No headache, seizures, numbness, tingling or weakness. PSYCHIATRIC: No depression, no loss of interest in normal activity or change in sleep pattern.     Exam: chaperone present  BP 124/88 mmHg  Ht 5' 4.5" (1.638 m)  Wt 171 lb (77.565 kg)  BMI 28.91 kg/m2  Body mass index is 28.91 kg/(m^2).  General appearance : Well developed well nourished female. No acute distress HEENT: Eyes: no retinal hemorrhage or exudates,  Neck supple, trachea midline, no carotid bruits, no thyroidmegaly Lungs: Clear to auscultation, no rhonchi or wheezes, or rib retractions  Heart: Regular rate and rhythm, no murmurs or gallops Breast:Examined in sitting and supine position were symmetrical in appearance, no palpable masses or tenderness,  no skin retraction, no nipple inversion, no nipple discharge, no skin discoloration, no axillary or supraclavicular lymphadenopathy Abdomen: no palpable masses or tenderness, no rebound or guarding Extremities: no edema or skin discoloration or tenderness  Pelvic:  Bartholin, Urethra, Skene Glands: Within normal limits             Vagina: No gross lesions or discharge  Cervix: Absent, non-tender and mobile\              Uterus: Absent  Adnexa  Without masses or tenderness  Anus and perineum  normal   Rectovaginal  normal sphincter tone without palpated masses or tenderness             Hemoccult cards will be provided     Assessment/Plan:  65 y.o. female for annual exam  will schedule her overdue bone density study here in the office in next few weeks. I've asked her to bring with her the fecal Hemoccult cards and will be provided for her today. Pap smear no longer indicated according to the new guidelines. We discussed importance of calcium and vitamin D for osteoporosis prevention. We discussed importance of monthly self breast examination. Patient to make an appointment with her PCP for her follow-up and blood work. Because of her history of vitamin D  deficiency will check a vitamin D level today. Patient received the flu vaccine today.   Terrance Mass MD, 4:02 PM 05/31/2015

## 2015-05-31 NOTE — Addendum Note (Signed)
Addended by: Terrance Mass on: 05/31/2015 04:45 PM   Modules accepted: Orders

## 2015-05-31 NOTE — Addendum Note (Signed)
Addended by: Thurnell Garbe A on: 05/31/2015 04:35 PM   Modules accepted: Orders, SmartSet

## 2015-06-01 LAB — VITAMIN D 25 HYDROXY (VIT D DEFICIENCY, FRACTURES): VIT D 25 HYDROXY: 18 ng/mL — AB (ref 30–100)

## 2015-06-06 ENCOUNTER — Other Ambulatory Visit: Payer: Self-pay | Admitting: Gynecology

## 2015-06-06 DIAGNOSIS — E559 Vitamin D deficiency, unspecified: Secondary | ICD-10-CM

## 2015-06-06 MED ORDER — VITAMIN D (ERGOCALCIFEROL) 1.25 MG (50000 UNIT) PO CAPS: 50000.0000 [IU] | ORAL_CAPSULE | ORAL | Status: DC

## 2015-08-02 ENCOUNTER — Other Ambulatory Visit: Payer: Self-pay | Admitting: Gynecology

## 2015-08-02 ENCOUNTER — Ambulatory Visit (INDEPENDENT_AMBULATORY_CARE_PROVIDER_SITE_OTHER): Payer: Federal, State, Local not specified - PPO

## 2015-08-02 DIAGNOSIS — Z780 Asymptomatic menopausal state: Secondary | ICD-10-CM

## 2015-08-02 DIAGNOSIS — M8580 Other specified disorders of bone density and structure, unspecified site: Secondary | ICD-10-CM

## 2015-08-02 DIAGNOSIS — Z13820 Encounter for screening for osteoporosis: Secondary | ICD-10-CM

## 2015-08-02 DIAGNOSIS — M899 Disorder of bone, unspecified: Secondary | ICD-10-CM | POA: Diagnosis not present

## 2015-09-06 ENCOUNTER — Other Ambulatory Visit: Payer: Federal, State, Local not specified - PPO

## 2015-09-07 ENCOUNTER — Other Ambulatory Visit: Payer: Federal, State, Local not specified - PPO

## 2015-09-07 DIAGNOSIS — E559 Vitamin D deficiency, unspecified: Secondary | ICD-10-CM

## 2015-09-08 LAB — VITAMIN D 25 HYDROXY (VIT D DEFICIENCY, FRACTURES): VIT D 25 HYDROXY: 35 ng/mL (ref 30–100)

## 2016-02-14 ENCOUNTER — Encounter: Payer: Self-pay | Admitting: Podiatry

## 2016-02-14 ENCOUNTER — Ambulatory Visit (INDEPENDENT_AMBULATORY_CARE_PROVIDER_SITE_OTHER): Payer: Federal, State, Local not specified - PPO | Admitting: Podiatry

## 2016-02-14 ENCOUNTER — Ambulatory Visit (INDEPENDENT_AMBULATORY_CARE_PROVIDER_SITE_OTHER): Payer: Federal, State, Local not specified - PPO

## 2016-02-14 VITALS — BP 122/82 | HR 69 | Resp 12

## 2016-02-14 DIAGNOSIS — M2040 Other hammer toe(s) (acquired), unspecified foot: Secondary | ICD-10-CM | POA: Diagnosis not present

## 2016-02-14 DIAGNOSIS — M79672 Pain in left foot: Secondary | ICD-10-CM | POA: Diagnosis not present

## 2016-02-14 DIAGNOSIS — Q828 Other specified congenital malformations of skin: Secondary | ICD-10-CM

## 2016-02-14 DIAGNOSIS — S86011A Strain of right Achilles tendon, initial encounter: Secondary | ICD-10-CM

## 2016-02-14 DIAGNOSIS — S86012A Strain of left Achilles tendon, initial encounter: Secondary | ICD-10-CM | POA: Diagnosis not present

## 2016-02-14 NOTE — Progress Notes (Signed)
   Subjective:    Patient ID: Janet Shelton, female    DOB: [birth year], 65 y.o.   MRN: [id number]  HPI: She presents today with a 2 week history of a cyst on the posterior aspect of her Achilles left foot. She states that is exquisitely sore and has been followed by her primary care for the past couple weeks. She is placed on prednisone and x-rays were taken all to no avail.    Review of Systems  Musculoskeletal: Positive for gait problem.       Objective:   Physical Exam: Vital signs are stable she is alert and oriented 3 pulses are palpable. Neurologic sensorium is intact. Deep tendon reflexes are intact. Orthopedic evaluation of his results was distal to the ankle range of motion without crepitation. She has a small nonpulsatile mass to the posterior aspect of the Achilles tendon near the watershed area. This is exquisitely tender on palpation and radiographic evaluation does demonstrated thickening of the Achilles tendon in this area.        Assessment & Plan:  Assessment: Probable small tear of the Achilles tendon.  Plan: Place her in a Cam Walker and a night splint to utilize for the next couple weeks or follow-up with her at that time in which time we will consider MRI evaluation.

## 2016-03-13 ENCOUNTER — Ambulatory Visit: Payer: Federal, State, Local not specified - PPO | Admitting: Podiatry

## 2016-03-13 ENCOUNTER — Encounter: Payer: Self-pay | Admitting: Podiatry

## 2016-03-13 ENCOUNTER — Telehealth: Payer: Self-pay | Admitting: *Deleted

## 2016-03-13 ENCOUNTER — Ambulatory Visit (INDEPENDENT_AMBULATORY_CARE_PROVIDER_SITE_OTHER): Payer: Federal, State, Local not specified - PPO | Admitting: Podiatry

## 2016-03-13 VITALS — BP 132/90 | HR 91 | Resp 16

## 2016-03-13 DIAGNOSIS — S86011D Strain of right Achilles tendon, subsequent encounter: Secondary | ICD-10-CM | POA: Diagnosis not present

## 2016-03-13 NOTE — Telephone Encounter (Signed)
MRI Left ankle faxed to [facility].

## 2016-03-13 NOTE — Progress Notes (Signed)
She presents today after having wearing her Cam Walker for the past month. She states that the foot feels the same as it did last time she's got burning sensation in the back of the heel at this point however possibly associated with the boot. She states that the nodule is still present and the pain is just as severe.  Objective: Vital signs are stable alert and oriented 3. Pulses are palpable. She has severe pain on palpation of the nodule on the posterior aspect of the Achilles just superior to the watershed area.  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

## 2016-03-14 ENCOUNTER — Encounter: Payer: Self-pay | Admitting: Gynecology

## 2016-03-21 ENCOUNTER — Ambulatory Visit
Admission: RE | Admit: 2016-03-21 | Discharge: 2016-03-21 | Disposition: A | Discharge status: Home / Self Care | Payer: Federal, State, Local not specified - PPO | Source: Ambulatory Visit | Attending: Podiatry | Admitting: Podiatry

## 2016-03-21 DIAGNOSIS — S86011D Strain of right Achilles tendon, subsequent encounter: Secondary | ICD-10-CM

## 2016-03-27 NOTE — Telephone Encounter (Addendum)
-----   Message from Garrel Ridgel, [place] sent at 03/26/2016  7:17 AM EDT ----- Send for and over read and inform patient of the delay. 03/27/2016-Pt called asking if she had to remain in the big boot until her appt on Thursday.  Left message informing pt of Dr. Stephenie Acres orders for overread, and explained the process. Also I explained to pt it would be best to wear the big boot until seen in office again because it stabilized more off the structure of the foot and leg that worked together to walk, and allow the areas to rest and not to continue to injure the areas, and to change her appt from next Thursday to 5-10 days from today, or if having problems call for an earlier appt. Copy of MRI disc mailed to [facility].

## 2016-04-03 ENCOUNTER — Ambulatory Visit: Payer: Federal, State, Local not specified - PPO | Admitting: Podiatry

## 2016-04-08 ENCOUNTER — Ambulatory Visit (INDEPENDENT_AMBULATORY_CARE_PROVIDER_SITE_OTHER): Payer: Federal, State, Local not specified - PPO | Admitting: Podiatry

## 2016-04-08 ENCOUNTER — Encounter: Payer: Self-pay | Admitting: Podiatry

## 2016-04-08 DIAGNOSIS — M7661 Achilles tendinitis, right leg: Secondary | ICD-10-CM | POA: Diagnosis not present

## 2016-04-08 NOTE — Progress Notes (Signed)
She presents today for follow-up of her MRI regarding her right lower extremity. She states this seems to be doing some better but I like to try to get around without the boot for a period of time before surgery is entertained.  Objective: Vital signs are stable alert and 3 MRI does demonstrate a tendinopathy 3 cm proximal to its insertion on the superior aspect of the calcaneus posteriorly. No tears are noted.  Assessment: I encouraged her to continue to utilize anti-inflammatories when she discontinued the use of the boot also recommended that she continue the use of the night splint and ice therapy.  Plan: Follow up with me as needed for surgical consideration.

## 2016-04-09 ENCOUNTER — Encounter: Payer: Self-pay | Admitting: Podiatry

## 2016-06-26 ENCOUNTER — Encounter: Payer: Federal, State, Local not specified - PPO | Admitting: Gynecology

## 2016-07-07 ENCOUNTER — Ambulatory Visit (INDEPENDENT_AMBULATORY_CARE_PROVIDER_SITE_OTHER): Payer: Medicare Other | Admitting: Gynecology

## 2016-07-07 ENCOUNTER — Encounter: Payer: Self-pay | Admitting: Gynecology

## 2016-07-07 VITALS — BP 126/80 | Ht 64.0 in | Wt 178.0 lb

## 2016-07-07 DIAGNOSIS — Z8639 Personal history of other endocrine, nutritional and metabolic disease: Secondary | ICD-10-CM | POA: Diagnosis not present

## 2016-07-07 DIAGNOSIS — Z01411 Encounter for gynecological examination (general) (routine) with abnormal findings: Secondary | ICD-10-CM | POA: Diagnosis not present

## 2016-07-07 DIAGNOSIS — M8580 Other specified disorders of bone density and structure, unspecified site: Secondary | ICD-10-CM | POA: Diagnosis not present

## 2016-07-07 NOTE — Progress Notes (Signed)
Janet Shelton June 08, 1951 [id number]   History:    66 y.o.  for annual gyn exam with no complaints today. Review of patient's record indicated that she had had history in the past of osteoporosis and had been on Prolia for 3 years and her last bone density study in 2017 demonstrated that her lowest T score was -1.3 in the AP spine. When compared with previous study in 2014 there was statistically significant decrease in bone mineralization and draped the AP spine of -5.5%, left hip of -4.3%, and right hip of -3.4%. She has had history vitamin D deficiency in the past but has not been taking her vitamin D so we'll check her vitamin D level today. Her PCP is at [facility] who is been doing her blood work. She states that her flu vaccine and shingles vaccines are up-to-date. Her last colonoscopy was in 2013 which was normal she is on a 10 year recall. Patient with no previous history of any abnormal Pap smears.  Past medical history,surgical history, family history and social history were all reviewed and documented in the EPIC chart.  Gynecologic History No LMP recorded. Patient has had a hysterectomy. Contraception: status post hysterectomy Last Pap: 2012. Results were: normal Last mammogram: 2017. Results were: normal  Obstetric History OB History  Gravida Para Term Preterm AB Living  1 0       0  SAB TAB Ectopic Multiple Live Births               # Outcome Date GA Lbr Len/2nd Weight Sex Delivery Anes PTL Lv  1 Gravida                ROS: A ROS was performed and pertinent positives and negatives are included in the history.  GENERAL: No fevers or chills. HEENT: No change in vision, no earache, sore throat or sinus congestion. NECK: No pain or stiffness. CARDIOVASCULAR: No chest pain or pressure. No palpitations. PULMONARY: No shortness of breath, cough or wheeze. GASTROINTESTINAL: No abdominal pain, nausea, vomiting or diarrhea, melena or bright red blood per rectum.  GENITOURINARY: No urinary frequency, urgency, hesitancy or dysuria. MUSCULOSKELETAL: No joint or muscle pain, no back pain, no recent trauma. DERMATOLOGIC: No rash, no itching, no lesions. ENDOCRINE: No polyuria, polydipsia, no heat or cold intolerance. No recent change in weight. HEMATOLOGICAL: No anemia or easy bruising or bleeding. NEUROLOGIC: No headache, seizures, numbness, tingling or weakness. PSYCHIATRIC: No depression, no loss of interest in normal activity or change in sleep pattern.     Exam: chaperone present  BP 126/80   Ht 5\' 4"  (1.626 m)   Wt 178 lb (80.7 kg)   BMI 30.55 kg/m   Body mass index is 30.55 kg/m.  General appearance : Well developed well nourished female. No acute distress HEENT: Eyes: no retinal hemorrhage or exudates,  Neck supple, trachea midline, no carotid bruits, no thyroidmegaly Lungs: Clear to auscultation, no rhonchi or wheezes, or rib retractions  Heart: Regular rate and rhythm, no murmurs or gallops Breast:Examined in sitting and supine position were symmetrical in appearance, no palpable masses or tenderness,  no skin retraction, no nipple inversion, no nipple discharge, no skin discoloration, no axillary or supraclavicular lymphadenopathy Abdomen: no palpable masses or tenderness, no rebound or guarding Extremities: no edema or skin discoloration or tenderness  Pelvic:  Bartholin, Urethra, Skene Glands: Within normal limits             Vagina: No gross lesions  or discharge, atrophic changes  Cervix: Absent  Uterus  absent  Adnexa  Without masses or tenderness  Anus and perineum  normal   Rectovaginal  normal sphincter tone without palpated masses or tenderness             Hemoccult cards will be provided     Assessment/Plan:  65 y.o. female for annual exam with history of osteopenia not taking her vitamin D. She has had history vitamin D deficiency in the past. We will check her vitamin D level today. We discussed importance of weightbearing  exercises. She was provided with fecal Hemoccult cards to submit to the office for testing. Her PCP is been doing her blood work. No Pap smear indicated.   Terrance Mass MD, 10:40 AM 07/07/2016

## 2016-07-08 ENCOUNTER — Other Ambulatory Visit: Payer: Self-pay | Admitting: Gynecology

## 2016-07-08 DIAGNOSIS — E559 Vitamin D deficiency, unspecified: Secondary | ICD-10-CM

## 2016-07-08 LAB — VITAMIN D 25 HYDROXY (VIT D DEFICIENCY, FRACTURES): Vit D, 25-Hydroxy: 21 ng/mL — ABNORMAL LOW (ref 30–100)

## 2016-07-08 MED ORDER — VITAMIN D (ERGOCALCIFEROL) 1.25 MG (50000 UNIT) PO CAPS: 50000.0000 [IU] | ORAL_CAPSULE | ORAL | 0 refills | Status: DC

## 2016-08-15 DIAGNOSIS — J111 Influenza due to unidentified influenza virus with other respiratory manifestations: Secondary | ICD-10-CM | POA: Diagnosis not present

## 2016-10-02 ENCOUNTER — Other Ambulatory Visit: Payer: Medicare Other

## 2016-10-02 DIAGNOSIS — E559 Vitamin D deficiency, unspecified: Secondary | ICD-10-CM

## 2016-10-03 LAB — VITAMIN D 25 HYDROXY (VIT D DEFICIENCY, FRACTURES): VIT D 25 HYDROXY: 40 ng/mL (ref 30–100)

## 2016-11-05 ENCOUNTER — Encounter: Payer: Self-pay | Admitting: Gynecology

## 2016-11-10 DIAGNOSIS — E663 Overweight: Secondary | ICD-10-CM | POA: Diagnosis not present

## 2016-11-10 DIAGNOSIS — E785 Hyperlipidemia, unspecified: Secondary | ICD-10-CM | POA: Diagnosis not present

## 2016-11-10 DIAGNOSIS — E559 Vitamin D deficiency, unspecified: Secondary | ICD-10-CM | POA: Diagnosis not present

## 2016-11-10 DIAGNOSIS — I10 Essential (primary) hypertension: Secondary | ICD-10-CM | POA: Diagnosis not present

## 2016-11-10 DIAGNOSIS — Z6829 Body mass index (BMI) 29.0-29.9, adult: Secondary | ICD-10-CM | POA: Diagnosis not present

## 2016-11-24 DIAGNOSIS — E785 Hyperlipidemia, unspecified: Secondary | ICD-10-CM | POA: Diagnosis not present

## 2016-11-24 DIAGNOSIS — E559 Vitamin D deficiency, unspecified: Secondary | ICD-10-CM | POA: Diagnosis not present

## 2016-11-24 DIAGNOSIS — I10 Essential (primary) hypertension: Secondary | ICD-10-CM | POA: Diagnosis not present

## 2017-03-20 DIAGNOSIS — Z1231 Encounter for screening mammogram for malignant neoplasm of breast: Secondary | ICD-10-CM | POA: Diagnosis not present

## 2017-03-23 DIAGNOSIS — R21 Rash and other nonspecific skin eruption: Secondary | ICD-10-CM | POA: Diagnosis not present

## 2017-03-24 DIAGNOSIS — H524 Presbyopia: Secondary | ICD-10-CM | POA: Diagnosis not present

## 2017-03-24 DIAGNOSIS — H18603 Keratoconus, unspecified, bilateral: Secondary | ICD-10-CM | POA: Diagnosis not present

## 2017-03-24 DIAGNOSIS — H25013 Cortical age-related cataract, bilateral: Secondary | ICD-10-CM | POA: Diagnosis not present

## 2017-03-24 DIAGNOSIS — H2513 Age-related nuclear cataract, bilateral: Secondary | ICD-10-CM | POA: Diagnosis not present

## 2017-04-07 DIAGNOSIS — Z23 Encounter for immunization: Secondary | ICD-10-CM | POA: Diagnosis not present

## 2017-04-24 DIAGNOSIS — E785 Hyperlipidemia, unspecified: Secondary | ICD-10-CM | POA: Diagnosis not present

## 2017-04-24 DIAGNOSIS — E559 Vitamin D deficiency, unspecified: Secondary | ICD-10-CM | POA: Diagnosis not present

## 2017-04-24 DIAGNOSIS — I10 Essential (primary) hypertension: Secondary | ICD-10-CM | POA: Diagnosis not present

## 2017-06-11 ENCOUNTER — Encounter: Payer: Self-pay | Admitting: Obstetrics & Gynecology

## 2017-06-11 ENCOUNTER — Ambulatory Visit (INDEPENDENT_AMBULATORY_CARE_PROVIDER_SITE_OTHER): Payer: Medicare Other | Admitting: Obstetrics & Gynecology

## 2017-06-11 VITALS — BP 146/92

## 2017-06-11 DIAGNOSIS — R300 Dysuria: Secondary | ICD-10-CM | POA: Diagnosis not present

## 2017-06-11 DIAGNOSIS — N766 Ulceration of vulva: Secondary | ICD-10-CM

## 2017-06-11 MED ORDER — VALACYCLOVIR HCL 500 MG PO TABS: 500.0000 mg | ORAL_TABLET | Freq: Two times a day (BID) | ORAL | 0 refills | Status: DC

## 2017-06-11 MED ORDER — SULFAMETHOXAZOLE-TRIMETHOPRIM 800-160 MG PO TABS: 1.0000 | ORAL_TABLET | Freq: Two times a day (BID) | ORAL | 0 refills | Status: AC

## 2017-06-11 NOTE — Progress Notes (Signed)
    Janet Shelton August 06, 1950 [id number]        66 y.o.  G1P0   Married.  Husband in [place] most of the time.  RP:  Vulvar irritation/burning x 2 days  HPI:  C/O vulvar irritation/burning x 2 days.  Pain when passing urine.  Last sexually active 02/2017.  No H/O Herpes.  Past medical history,surgical history, problem list, medications, allergies, family history and social history were all reviewed and documented in the EPIC chart.  Directed ROS with pertinent positives and negatives documented in the history of present illness/assessment and plan.  Exam:  Vitals:   06/11/17 1248  BP: (!) 146/92   General appearance:  Normal  Gyn exam:  Vulva:  2 ulcers at upper perineum/posterior fourchette.  HSV sure swab done.  U/A Cloudy, nitrites neg, moderate bacteria, WBC 6-10.  Pending Urine Culture.    Assessment/Plan:  66 y.o. G1P0   1. Dysuria Mild perturbation of urine analysis.  Will do urine culture.  Decision to treat with Bactrim in the meantime.  No contraindication, no allergy.  Usage, risks and benefits reviewed.  Prescription sent to pharmacy.  - Urinalysis with Culture Reflex  2. Vulvar ulcer Ulcerative lesions on vulva at the posterior fourchette/perineum compatible with genital herpes.  Possible diagnosis of herpes discussed with patient.  Decision to treat with valacyclovir given the high likelihood of the diagnosis.  Patient agrees.  Usage, risks and benefits reviewed.  - SureSwab HSV, Type 1/2 DNA, PCR  Other orders - sulfamethoxazole-trimethoprim (BACTRIM DS,SEPTRA DS) 800-160 MG tablet; Take 1 tablet by mouth 2 (two) times daily for 3 days. - valACYclovir (VALTREX) 500 MG tablet; Take 1 tablet (500 mg total) by mouth 2 (two) times daily for 5 days.  Counseling on above issues more than 50% for 25 minutes.  Princess Bruins MD, 1:20 PM 06/11/2017

## 2017-06-13 LAB — URINALYSIS W MICROSCOPIC + REFLEX CULTURE
Bilirubin Urine: NEGATIVE
Glucose, UA: NEGATIVE
HYALINE CAST: NONE SEEN /LPF
KETONES UR: NEGATIVE
NITRITES URINE, INITIAL: NEGATIVE
Protein, ur: NEGATIVE
RBC / HPF: NONE SEEN /HPF (ref 0–2)
Specific Gravity, Urine: 1.015 (ref 1.001–1.03)
pH: 8.5 — ABNORMAL HIGH (ref 5.0–8.0)

## 2017-06-13 LAB — URINE CULTURE
MICRO NUMBER: 81439313
RESULT: NO GROWTH
SPECIMEN QUALITY: ADEQUATE

## 2017-06-13 LAB — CULTURE INDICATED

## 2017-06-13 LAB — SURESWAB HSV, TYPE 1/2 DNA, PCR
HSV 1 DNA: NOT DETECTED
HSV 2 DNA: DETECTED — AB

## 2017-06-14 ENCOUNTER — Encounter: Payer: Self-pay | Admitting: Obstetrics & Gynecology

## 2017-06-14 NOTE — Patient Instructions (Signed)
1. Dysuria Mild perturbation of urine analysis.  Will do urine culture.  Decision to treat with Bactrim in the meantime.  No contraindication, no allergy.  Usage, risks and benefits reviewed.  Prescription sent to pharmacy.  - Urinalysis with Culture Reflex  2. Vulvar ulcer Ulcerative lesions on vulva at the posterior fourchette/perineum compatible with genital herpes.  Possible diagnosis of herpes discussed with patient.  Decision to treat with valacyclovir given the high likelihood of the diagnosis.  Patient agrees.  Usage, risks and benefits reviewed.  - SureSwab HSV, Type 1/2 DNA, PCR  Other orders - sulfamethoxazole-trimethoprim (BACTRIM DS,SEPTRA DS) 800-160 MG tablet; Take 1 tablet by mouth 2 (two) times daily for 3 days. - valACYclovir (VALTREX) 500 MG tablet; Take 1 tablet (500 mg total) by mouth 2 (two) times daily for 5 days.  Janet Shelton, it was a pleasure meeting you today!  I will inform you of your results as soon as available.   Genital Herpes Genital herpes is a common sexually transmitted infection (STI) that is caused by a virus. The virus spreads from person to person through sexual contact. Infection can cause itching, blisters, and sores around the genitals or rectum. Symptoms may last several days and then go away This is called an outbreak. However, the virus remains in your body, so you may have more outbreaks in the future. The time between outbreaks varies and can be months or years. Genital herpes affects men and women. It is particularly concerning for pregnant women because the virus can be passed to the baby during delivery and can cause serious problems. Genital herpes is also a concern for people who have a weak disease-fighting (immune) system. What are the causes? This condition is caused by the herpes simplex virus (HSV) type 1 or type 2. The virus may spread through:  Sexual contact with an infected person, including vaginal, anal, and oral sex.  Contact with  fluid from a herpes sore.  The skin. This means that you can get herpes from an infected partner even if he or she does not have a visible sore or does not know that he or she is infected.  What increases the risk? You are more likely to develop this condition if:  You have sex with many partners.  You do not use latex condoms during sex.  What are the signs or symptoms? Most people do not have symptoms (asymptomatic) or have mild symptoms that may be mistaken for other skin problems. Symptoms may include:  Small red bumps near the genitals, rectum, or mouth. These bumps turn into blisters and then turn into sores.  Flu-like symptoms, including: ? Fever. ? Body aches. ? Swollen lymph nodes. ? Headache.  Painful urination.  Pain and itching in the genital area or rectal area.  Vaginal discharge.  Tingling or shooting pain in the legs and buttocks.  Generally, symptoms are more severe and last longer during the first (primary) outbreak. Flu-like symptoms are also more common during the primary outbreak. How is this diagnosed? Genital herpes may be diagnosed based on:  A physical exam.  Your medical history.  Blood tests.  A test of a fluid sample (culture) from an open sore.  How is this treated? There is no cure for this condition, but treatment with antiviral medicines that are taken by mouth (orally) can do the following:  Speed up healing and relieve symptoms.  Help to reduce the spread of the virus to sexual partners.  Limit the chance of future  outbreaks, or make future outbreaks shorter.  Lessen symptoms of future outbreaks.  Your health care provider may also recommend pain relief medicines, such as aspirin or ibuprofen. Follow these instructions at home: Sexual activity  Do not have sexual contact during active outbreaks.  Practice safe sex. Latex condoms and female condoms may help prevent the spread of the herpes virus. General instructions  Keep  the affected areas dry and clean.  Take over-the-counter and prescription medicines only as told by your health care provider.  Avoid rubbing or touching blisters and sores. If you do touch blisters or sores: ? Wash your hands thoroughly with soap and water. ? Do not touch your eyes afterward.  To help relieve pain or itching, you may take the following actions as directed by your health care provider: ? Apply a cold, wet cloth (cold compress) to affected areas 4-6 times a day. ? Apply a substance that protects your skin and reduces bleeding (astringent). ? Apply a gel that helps relieve pain around sores (lidocaine gel). ? Take a warm, shallow bath that cleans the genital area (sitz bath).  Keep all follow-up visits as told by your health care provider. This is important. How is this prevented?  Use condoms. Although anyone can get genital herpes during sexual contact, even with the use of a condom, a condom can provide some protection.  Avoid having multiple sexual partners.  Talk with your sexual partner about any symptoms either of you may have. Also, talk with your partner about any history of STIs.  Get tested for STIs before you have sex. Ask your partner to do the same.  Do not have sexual contact if you have symptoms of genital herpes. Contact a health care provider if:  Your symptoms are not improving with medicine.  Your symptoms return.  You have new symptoms.  You have a fever.  You have abdominal pain.  You have redness, swelling, or pain in your eye.  You notice new sores on other parts of your body.  You are a woman and experience bleeding between menstrual periods.  You have had herpes and you become pregnant or plan to become pregnant. Summary  Genital herpes is a common sexually transmitted infection (STI) that is caused by the herpes simplex virus (HSV) type 1 or type 2.  These viruses are most often spread through sexual contact with an infected  person.  You are more likely to develop this condition if you have sex with many partners or you have unprotected sex.  Most people do not have symptoms (asymptomatic) or have mild symptoms that may be mistaken for other skin problems. Symptoms occur as outbreaks that may happen months or years apart.  There is no cure for this condition, but treatment with oral antiviral medicines can reduce symptoms, reduce the chance of spreading the virus to a partner, prevent future outbreaks, or shorten future outbreaks. This information is not intended to replace advice given to you by your health care provider. Make sure you discuss any questions you have with your health care provider. Document Released: 06/06/2000 Document Revised: 05/09/2016 Document Reviewed: 05/09/2016 Elsevier Interactive Patient Education  [employer].

## 2017-07-02 ENCOUNTER — Encounter: Payer: Self-pay | Admitting: Obstetrics & Gynecology

## 2017-07-02 ENCOUNTER — Ambulatory Visit (INDEPENDENT_AMBULATORY_CARE_PROVIDER_SITE_OTHER): Payer: Medicare Other | Admitting: Obstetrics & Gynecology

## 2017-07-02 VITALS — BP 132/86

## 2017-07-02 DIAGNOSIS — L723 Sebaceous cyst: Secondary | ICD-10-CM

## 2017-07-02 DIAGNOSIS — Z8619 Personal history of other infectious and parasitic diseases: Secondary | ICD-10-CM

## 2017-07-02 NOTE — Progress Notes (Signed)
    Janet Shelton 05-Oct-1950 [id number]        67 y.o.  G1P0 Married  RP:  Vulva bumps x 1 week, now improving.  HPI: Patient was last seen by myself on June 11, 2017 with a recurrence of herpes genitalis.  At that time she did not remember, but now she does remember a previous episode 8 years ago.  No current ulcer on the vulva.  She now presents because of small bumps on the vulva that are decreasing in size at this time.  No longer painful, no drainage.  Normal vaginal secretions.  No postmenopausal bleeding.  Urine and bowel movements normal.  No fever.  Past medical history,surgical history, problem list, medications, allergies, family history and social history were all reviewed and documented in the EPIC chart.  Directed ROS with pertinent positives and negatives documented in the history of present illness/assessment and plan.  Exam:  Vitals:   07/02/17 1017  BP: 132/86   General appearance:  Normal  Gynecologic exam:  Vulva:  Small sebaceous gland cyst at post Right and Left Vulva.  No erythema, non-tender.  No ulceration.   Assessment/Plan:  67 y.o. G1P0   1. Sebaceous cyst Two vulvar sebaceous cyst present on the vulva.  No sign of infection.  Per patient cysts getting smaller.  Recommend warm soaking in clear water baths daily to favor resolution and prevent recurrence.  Patient reassured.  2. 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  Probably a recurrence of herpes genitalis, as patient remembers now having had an episode 8 years ago.  No current recurrence.  Patient has valaciclovir medication already prescribed to treat recurrences.  Diagnosis and management discussed.  Patient reassured.  Counseling on above issues more than 50% for 15 minutes.  Princess Bruins MD, 10:28 AM 07/02/2017

## 2017-07-02 NOTE — Patient Instructions (Signed)
1. Sebaceous cyst Two vulvar sebaceous cyst present on the vulva.  No sign of infection.  Per patient cysts getting smaller.  Recommend warm soaking in clear water baths daily to favor resolution and prevent recurrence.  Patient reassured.  2. 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  Probably a recurrence of herpes genitalis, as patient remembers now having had an episode 8 years ago.  No current recurrence.  Patient has valaciclovir medication already prescribed to treat recurrences.  Diagnosis and management discussed.  Patient reassured.  Margaretha Sheffield, good seeing you today!

## 2017-07-10 ENCOUNTER — Ambulatory Visit (INDEPENDENT_AMBULATORY_CARE_PROVIDER_SITE_OTHER): Payer: Medicare Other | Admitting: Obstetrics & Gynecology

## 2017-07-10 ENCOUNTER — Encounter: Payer: Self-pay | Admitting: Obstetrics & Gynecology

## 2017-07-10 VITALS — BP 104/88 | Ht 64.5 in | Wt 180.0 lb

## 2017-07-10 DIAGNOSIS — Z90710 Acquired absence of both cervix and uterus: Secondary | ICD-10-CM

## 2017-07-10 DIAGNOSIS — Z9189 Other specified personal risk factors, not elsewhere classified: Secondary | ICD-10-CM

## 2017-07-10 DIAGNOSIS — Z780 Asymptomatic menopausal state: Secondary | ICD-10-CM

## 2017-07-10 DIAGNOSIS — Z90722 Acquired absence of ovaries, bilateral: Secondary | ICD-10-CM

## 2017-07-10 DIAGNOSIS — Z9079 Acquired absence of other genital organ(s): Secondary | ICD-10-CM

## 2017-07-10 DIAGNOSIS — E6609 Other obesity due to excess calories: Secondary | ICD-10-CM

## 2017-07-10 DIAGNOSIS — Z1272 Encounter for screening for malignant neoplasm of vagina: Secondary | ICD-10-CM | POA: Diagnosis not present

## 2017-07-10 DIAGNOSIS — Z01411 Encounter for gynecological examination (general) (routine) with abnormal findings: Secondary | ICD-10-CM | POA: Diagnosis not present

## 2017-07-10 DIAGNOSIS — Z6830 Body mass index (BMI) 30.0-30.9, adult: Secondary | ICD-10-CM

## 2017-07-10 DIAGNOSIS — Z13820 Encounter for screening for osteoporosis: Secondary | ICD-10-CM

## 2017-07-10 NOTE — Addendum Note (Signed)
Addended by: Thurnell Garbe A on: 07/10/2017 09:52 AM   Modules accepted: Orders

## 2017-07-10 NOTE — Patient Instructions (Signed)
1. Encounter for gynecological examination with abnormal finding Gynecologic exam status post total hysterectomy and BSO.  Pap reflex done on the vaginal vault.  Breast exam normal.  Screening mammogram benign in September 2018.  Colonoscopy in 2013.  Referred to dermatology for full skin exam.  Health labs with family physician.  2. Status post total hysterectomy and bilateral salpingo-oophorectomy (BSO)   3. Menopause present Well on no hormone replacement therapy.  4. Screening for osteoporosis Vitamin D supplements, calcium rich nutrition and regular weightbearing physical activity recommended.  Per patient she increased her vitamin D supplements and we will recheck a level with her family physician.  Will schedule bone density here in March 2019. - Bone density  5. Class 1 obesity due to excess calories without serious comorbidity with body mass index (BMI) of 30.0 to 30.9 in adult Low calorie/low carb diet discussed and [employer] recommended.  Increase aerobic physical activity to at least 5 times a week and weightlifting recommended every 2 days.  Janet Shelton, it was a pleasure seeing you today!  I will inform you of your results as soon as they are available.   Health Maintenance for Postmenopausal Women Menopause is a normal process in which your reproductive ability comes to an end. This process happens gradually over a span of months to years, usually between the ages of 20 and 8. Menopause is complete when you have missed 12 consecutive menstrual periods. It is important to talk with your health care provider about some of the most common conditions that affect postmenopausal women, such as heart disease, cancer, and bone loss (osteoporosis). Adopting a healthy lifestyle and getting preventive care can help to promote your health and wellness. Those actions can also lower your chances of developing some of these common conditions. What should I know about menopause? During  menopause, you may experience a number of symptoms, such as:  Moderate-to-severe hot flashes.  Night sweats.  Decrease in sex drive.  Mood swings.  Headaches.  Tiredness.  Irritability.  Memory problems.  Insomnia.  Choosing to treat or not to treat menopausal changes is an individual decision that you make with your health care provider. What should I know about hormone replacement therapy and supplements? Hormone therapy products are effective for treating symptoms that are associated with menopause, such as hot flashes and night sweats. Hormone replacement carries certain risks, especially as you become older. If you are thinking about using estrogen or estrogen with progestin treatments, discuss the benefits and risks with your health care provider. What should I know about heart disease and stroke? Heart disease, heart attack, and stroke become more likely as you age. This may be due, in part, to the hormonal changes that your body experiences during menopause. These can affect how your body processes dietary fats, triglycerides, and cholesterol. Heart attack and stroke are both medical emergencies. There are many things that you can do to help prevent heart disease and stroke:  Have your blood pressure checked at least every 1-2 years. High blood pressure causes heart disease and increases the risk of stroke.  If you are 70-19 years old, ask your health care provider if you should take aspirin to prevent a heart attack or a stroke.  Do not use any tobacco products, including cigarettes, chewing tobacco, or electronic cigarettes. If you need help quitting, ask your health care provider.  It is important to eat a healthy diet and maintain a healthy weight. ? Be sure to include plenty of  vegetables, fruits, low-fat dairy products, and lean protein. ? Avoid eating foods that are high in solid fats, added sugars, or salt (sodium).  Get regular exercise. This is one of the most  important things that you can do for your health. ? Try to exercise for at least 150 minutes each week. The type of exercise that you do should increase your heart rate and make you sweat. This is known as moderate-intensity exercise. ? Try to do strengthening exercises at least twice each week. Do these in addition to the moderate-intensity exercise.  Know your numbers.Ask your health care provider to check your cholesterol and your blood glucose. Continue to have your blood tested as directed by your health care provider.  What should I know about cancer screening? There are several types of cancer. Take the following steps to reduce your risk and to catch any cancer development as early as possible. Breast Cancer  Practice breast self-awareness. ? This means understanding how your breasts normally appear and feel. ? It also means doing regular breast self-exams. Let your health care provider know about any changes, no matter how small.  If you are 43 or older, have a clinician do a breast exam (clinical breast exam or CBE) every year. Depending on your age, family history, and medical history, it may be recommended that you also have a yearly breast X-ray (mammogram).  If you have a family history of breast cancer, talk with your health care provider about genetic screening.  If you are at high risk for breast cancer, talk with your health care provider about having an MRI and a mammogram every year.  Breast cancer (BRCA) gene test is recommended for women who have family members with BRCA-related cancers. Results of the assessment will determine the need for genetic counseling and BRCA1 and for BRCA2 testing. BRCA-related cancers include these types: ? Breast. This occurs in males or females. ? Ovarian. ? Tubal. This may also be called fallopian tube cancer. ? Cancer of the abdominal or pelvic lining (peritoneal cancer). ? Prostate. ? Pancreatic.  Cervical, Uterine, and Ovarian  Cancer Your health care provider may recommend that you be screened regularly for cancer of the pelvic organs. These include your ovaries, uterus, and vagina. This screening involves a pelvic exam, which includes checking for microscopic changes to the surface of your cervix (Pap test).  For women ages 21-65, health care providers may recommend a pelvic exam and a Pap test every three years. For women ages 19-65, they may recommend the Pap test and pelvic exam, combined with testing for human papilloma virus (HPV), every five years. Some types of HPV increase your risk of cervical cancer. Testing for HPV may also be done on women of any age who have unclear Pap test results.  Other health care providers may not recommend any screening for nonpregnant women who are considered low risk for pelvic cancer and have no symptoms. Ask your health care provider if a screening pelvic exam is right for you.  If you have had past treatment for cervical cancer or a condition that could lead to cancer, you need Pap tests and screening for cancer for at least 20 years after your treatment. If Pap tests have been discontinued for you, your risk factors (such as having a new sexual partner) need to be reassessed to determine if you should start having screenings again. Some women have medical problems that increase the chance of getting cervical cancer. In these cases, your health care  provider may recommend that you have screening and Pap tests more often.  If you have a family history of uterine cancer or ovarian cancer, talk with your health care provider about genetic screening.  If you have vaginal bleeding after reaching menopause, tell your health care provider.  There are currently no reliable tests available to screen for ovarian cancer.  Lung Cancer Lung cancer screening is recommended for adults 60-72 years old who are at high risk for lung cancer because of a history of smoking. A yearly low-dose CT scan  of the lungs is recommended if you:  Currently smoke.  Have a history of at least 30 pack-years of smoking and you currently smoke or have quit within the past 15 years. A pack-year is smoking an average of one pack of cigarettes per day for one year.  Yearly screening should:  Continue until it has been 15 years since you quit.  Stop if you develop a health problem that would prevent you from having lung cancer treatment.  Colorectal Cancer  This type of cancer can be detected and can often be prevented.  Routine colorectal cancer screening usually begins at age 33 and continues through age 38.  If you have risk factors for colon cancer, your health care provider may recommend that you be screened at an earlier age.  If you have a family history of colorectal cancer, talk with your health care provider about genetic screening.  Your health care provider may also recommend using home test kits to check for hidden blood in your stool.  A small camera at the end of a tube can be used to examine your colon directly (sigmoidoscopy or colonoscopy). This is done to check for the earliest forms of colorectal cancer.  Direct examination of the colon should be repeated every 5-10 years until age 74. However, if early forms of precancerous polyps or small growths are found or if you have a family history or genetic risk for colorectal cancer, you may need to be screened more often.  Skin Cancer  Check your skin from head to toe regularly.  Monitor any moles. Be sure to tell your health care provider: ? About any new moles or changes in moles, especially if there is a change in a mole's shape or color. ? If you have a mole that is larger than the size of a pencil eraser.  If any of your family members has a history of skin cancer, especially at a young age, talk with your health care provider about genetic screening.  Always use sunscreen. Apply sunscreen liberally and repeatedly  throughout the day.  Whenever you are outside, protect yourself by wearing long sleeves, pants, a wide-brimmed hat, and sunglasses.  What should I know about osteoporosis? Osteoporosis is a condition in which bone destruction happens more quickly than new bone creation. After menopause, you may be at an increased risk for osteoporosis. To help prevent osteoporosis or the bone fractures that can happen because of osteoporosis, the following is recommended:  If you are 41-38 years old, get at least 1,000 mg of calcium and at least 600 mg of vitamin D per day.  If you are older than age 19 but younger than age 26, get at least 1,200 mg of calcium and at least 600 mg of vitamin D per day.  If you are older than age 29, get at least 1,200 mg of calcium and at least 800 mg of vitamin D per day.  Smoking and  excessive alcohol intake increase the risk of osteoporosis. Eat foods that are rich in calcium and vitamin D, and do weight-bearing exercises several times each week as directed by your health care provider. What should I know about how menopause affects my mental health? Depression may occur at any age, but it is more common as you become older. Common symptoms of depression include:  Low or sad mood.  Changes in sleep patterns.  Changes in appetite or eating patterns.  Feeling an overall lack of motivation or enjoyment of activities that you previously enjoyed.  Frequent crying spells.  Talk with your health care provider if you think that you are experiencing depression. What should I know about immunizations? It is important that you get and maintain your immunizations. These include:  Tetanus, diphtheria, and pertussis (Tdap) booster vaccine.  Influenza every year before the flu season begins.  Pneumonia vaccine.  Shingles vaccine.  Your health care provider may also recommend other immunizations. This information is not intended to replace advice given to you by your health  care provider. Make sure you discuss any questions you have with your health care provider. Document Released: 08/01/2005 Document Revised: 12/28/2015 Document Reviewed: 03/13/2015 Elsevier Interactive Patient Education  2018 [employer].

## 2017-07-10 NOTE — Progress Notes (Signed)
Janet Shelton 10-Nov-1950 [id number]   History:    67 y.o. G1P0A1 Married  RP:  Established patient presenting for annual gyn exam   HPI: Status post hysterectomy and bilateral salpingo-oophorectomy.  No pelvic pain.  Normal vaginal secretions.  Occasionally sexually active with husband who lives in [place] to take care of his 60 year old parent.  Urine and bowel movements normal.  Breasts normal.  Difficulty sleeping.  Body mass index 30.42.  About to get a rescue dog with whom she will start walking more regularly.  Health labs with family physician.  Past medical history,surgical history, family history and social history were all reviewed and documented in the EPIC chart.  Gynecologic History No LMP recorded. Patient has had a hysterectomy. Contraception: status post hysterectomy Last Pap: 2012. Results were: normal Last mammogram: 02/2017. Results were: Benign Bone density Osteopenia T-score -1.3 at spine [place] 2013  Obstetric History OB History  Gravida Para Term Preterm AB Living  1 0       0  SAB TAB Ectopic Multiple Live Births               # Outcome Date GA Lbr Len/2nd Weight Sex Delivery Anes PTL Lv  1 Gravida                ROS: A ROS was performed and pertinent positives and negatives are included in the history.  GENERAL: No fevers or chills. HEENT: No change in vision, no earache, sore throat or sinus congestion. NECK: No pain or stiffness. CARDIOVASCULAR: No chest pain or pressure. No palpitations. PULMONARY: No shortness of breath, cough or wheeze. GASTROINTESTINAL: No abdominal pain, nausea, vomiting or diarrhea, melena or bright red blood per rectum. GENITOURINARY: No urinary frequency, urgency, hesitancy or dysuria. MUSCULOSKELETAL: No joint or muscle pain, no back pain, no recent trauma. DERMATOLOGIC: No rash, no itching, no lesions. ENDOCRINE: No polyuria, polydipsia, no heat or cold intolerance. No recent change in weight. HEMATOLOGICAL: No anemia  or easy bruising or bleeding. NEUROLOGIC: No headache, seizures, numbness, tingling or weakness. PSYCHIATRIC: No depression, no loss of interest in normal activity or change in sleep pattern.     Exam:   BP 104/88   Ht 5' 4.5" (1.638 m)   Wt 180 lb (81.6 kg)   BMI 30.42 kg/m   Body mass index is 30.42 kg/m.  General appearance : Well developed well nourished female. No acute distress HEENT: Eyes: no retinal hemorrhage or exudates,  Neck supple, trachea midline, no carotid bruits, no thyroidmegaly Lungs: Clear to auscultation, no rhonchi or wheezes, or rib retractions  Heart: Regular rate and rhythm, no murmurs or gallops Breast:Examined in sitting and supine position were symmetrical in appearance, no palpable masses or tenderness,  no skin retraction, no nipple inversion, no nipple discharge, no skin discoloration, no axillary or supraclavicular lymphadenopathy Abdomen: no palpable masses or tenderness, no rebound or guarding Extremities: no edema or skin discoloration or tenderness  Pelvic: Vulva normal  Bartholin, Urethra, Skene Glands: Within normal limits             Vagina: No gross lesions or discharge. Pap reflex done.  Cervix/Uterus absent  Adnexa  Without masses or tenderness  Anus and perineum  normal    Assessment/Plan:  67 y.o. female for annual exam   1. Encounter for gynecological examination with abnormal finding Gynecologic exam status post total hysterectomy and BSO.  Pap reflex done on the vaginal vault.  Breast exam normal.  Screening mammogram  benign in September 2018.  Colonoscopy in 2013.  Referred to dermatology for full skin exam.  Health labs with family physician.  2. Status post total hysterectomy and bilateral salpingo-oophorectomy (BSO)   3. Menopause present Well on no hormone replacement therapy.  4. Screening for osteoporosis Vitamin D supplements, calcium rich nutrition and regular weightbearing physical activity recommended.  Per patient  she increased her vitamin D supplements and we will recheck a level with her family physician.  Will schedule bone density here in March 2019. - Bone density  5. Class 1 obesity due to excess calories without serious comorbidity with body mass index (BMI) of 30.0 to 30.9 in adult Low calorie/low carb diet discussed and [employer] recommended.  Increase aerobic physical activity to at least 5 times a week and weightlifting recommended every 2 days.  Counseling on above issues more than 50% for 10 minutes.  Princess Bruins MD, 9:23 AM 07/10/2017

## 2017-07-14 LAB — PAP IG W/ RFLX HPV ASCU

## 2017-07-15 ENCOUNTER — Telehealth: Payer: Self-pay | Admitting: *Deleted

## 2017-07-15 NOTE — Telephone Encounter (Signed)
Patient scheduled on 08/06/17 @ 2:15pm at [facility] dermatology center,pt informed.

## 2017-07-15 NOTE — Telephone Encounter (Signed)
-----   Message from Princess Bruins, MD sent at 07/10/2017  9:43 AM EST ----- Regarding: Refer to [facility] Full skin exam.

## 2017-08-06 DIAGNOSIS — D229 Melanocytic nevi, unspecified: Secondary | ICD-10-CM | POA: Diagnosis not present

## 2017-08-06 DIAGNOSIS — L821 Other seborrheic keratosis: Secondary | ICD-10-CM | POA: Diagnosis not present

## 2017-08-10 ENCOUNTER — Other Ambulatory Visit: Payer: Self-pay | Admitting: Gynecology

## 2017-08-10 DIAGNOSIS — Z780 Asymptomatic menopausal state: Secondary | ICD-10-CM

## 2017-08-21 DIAGNOSIS — M8580 Other specified disorders of bone density and structure, unspecified site: Secondary | ICD-10-CM

## 2017-08-21 HISTORY — DX: Other specified disorders of bone density and structure, unspecified site: M85.80

## 2017-08-24 ENCOUNTER — Encounter: Payer: Self-pay | Admitting: Gynecology

## 2017-08-24 ENCOUNTER — Other Ambulatory Visit: Payer: Self-pay | Admitting: Gynecology

## 2017-08-24 ENCOUNTER — Ambulatory Visit (INDEPENDENT_AMBULATORY_CARE_PROVIDER_SITE_OTHER): Payer: Medicare Other

## 2017-08-24 DIAGNOSIS — Z780 Asymptomatic menopausal state: Secondary | ICD-10-CM

## 2017-08-24 DIAGNOSIS — M8588 Other specified disorders of bone density and structure, other site: Secondary | ICD-10-CM | POA: Diagnosis not present

## 2017-12-27 ENCOUNTER — Other Ambulatory Visit: Payer: Self-pay | Admitting: Obstetrics & Gynecology

## 2018-01-22 DIAGNOSIS — E785 Hyperlipidemia, unspecified: Secondary | ICD-10-CM | POA: Diagnosis not present

## 2018-01-22 DIAGNOSIS — J302 Other seasonal allergic rhinitis: Secondary | ICD-10-CM | POA: Diagnosis not present

## 2018-01-22 DIAGNOSIS — E559 Vitamin D deficiency, unspecified: Secondary | ICD-10-CM | POA: Diagnosis not present

## 2018-01-22 DIAGNOSIS — I10 Essential (primary) hypertension: Secondary | ICD-10-CM | POA: Diagnosis not present

## 2018-03-22 DIAGNOSIS — Z1231 Encounter for screening mammogram for malignant neoplasm of breast: Secondary | ICD-10-CM | POA: Diagnosis not present

## 2018-03-24 DIAGNOSIS — H2513 Age-related nuclear cataract, bilateral: Secondary | ICD-10-CM | POA: Diagnosis not present

## 2018-03-24 DIAGNOSIS — H18603 Keratoconus, unspecified, bilateral: Secondary | ICD-10-CM | POA: Diagnosis not present

## 2018-03-24 DIAGNOSIS — H25013 Cortical age-related cataract, bilateral: Secondary | ICD-10-CM | POA: Diagnosis not present

## 2018-03-24 DIAGNOSIS — H524 Presbyopia: Secondary | ICD-10-CM | POA: Diagnosis not present

## 2018-03-31 DIAGNOSIS — Z23 Encounter for immunization: Secondary | ICD-10-CM | POA: Diagnosis not present

## 2018-04-30 IMAGING — MR MR ANKLE*L* W/O CM
4 of 5 series · 31 of 40 positions shown · non-contrast
Comparison: Plain films on the left foot 02/14/2016.

CLINICAL DATA: Posterior ankle pain since January 2016. Question
Achilles tendon tear.

EXAM:
MRI OF THE LEFT ANKLE WITHOUT CONTRAST
TECHNIQUE: Multiplanar, multisequence MR imaging of the ankle was performed. No
intravenous contrast was administered.

[Series 5: T2 fat-sat · sagittal · 3.0mm · 0.33mm/px · 7 of 21 slices shown (1 of 3)]
[im 1/21]
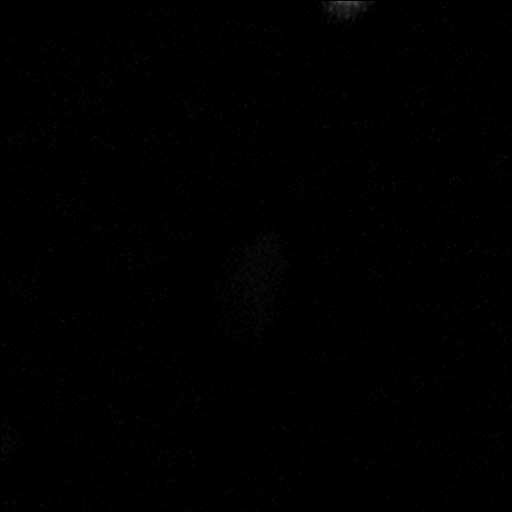
[im 4/21]
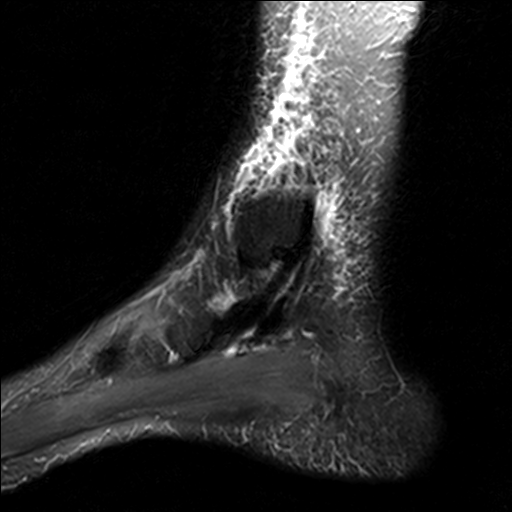
[im 7/21]
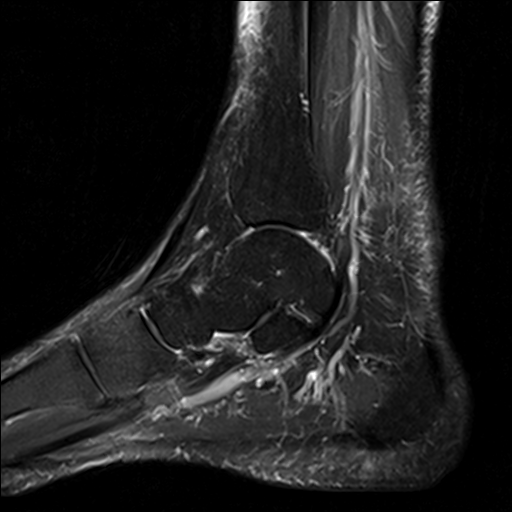
[im 11/21]
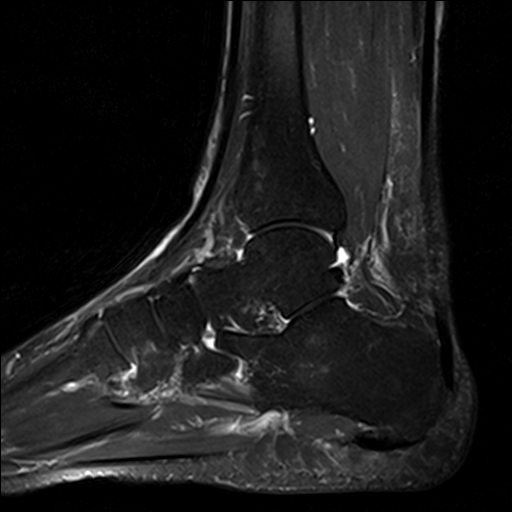
[im 14/21]
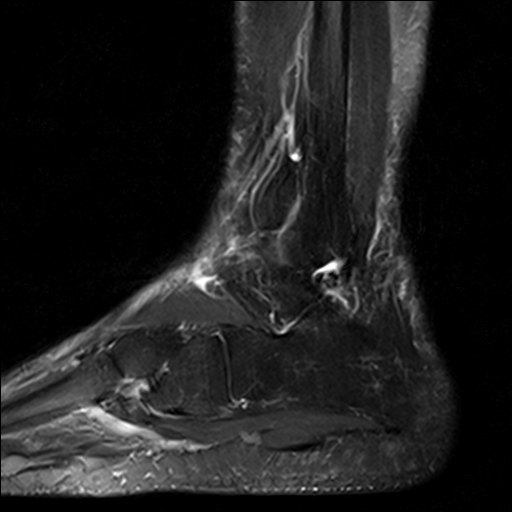
[im 17/21]
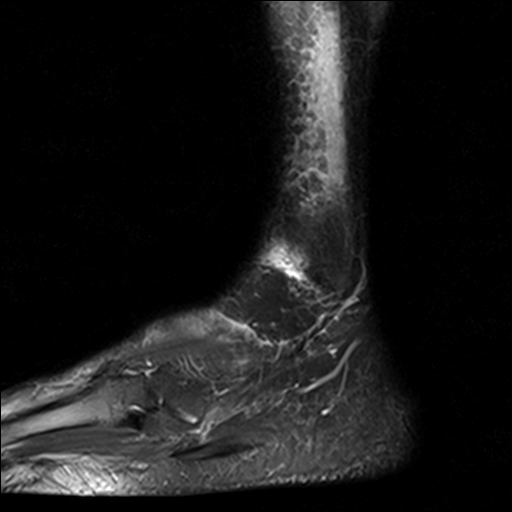
[im 21/21]
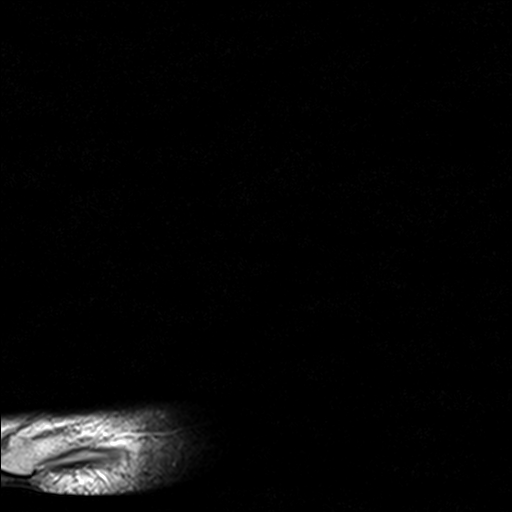

[Series 6: T2 fat-sat · axial · 4.0mm · 0.53mm/px · z∈[-63,+82]mm · 9 of 30 slices shown (2 of 3)]
[im 1/30]
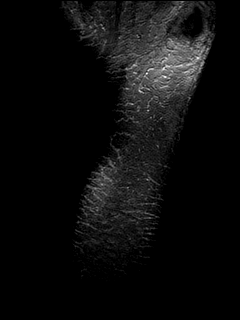
[im 4/30]
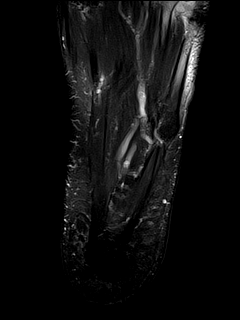
[im 8/30]
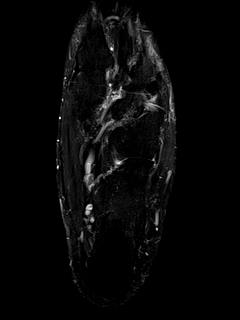
[im 11/30]
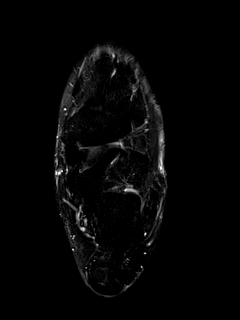
[im 15/30]
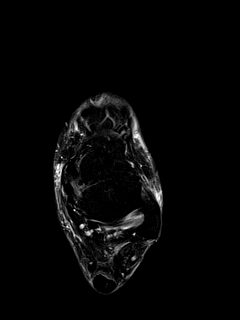
[im 19/30]
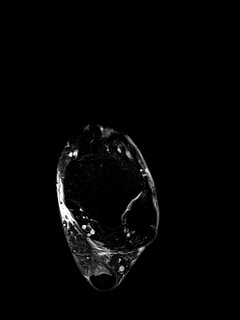
[im 22/30]
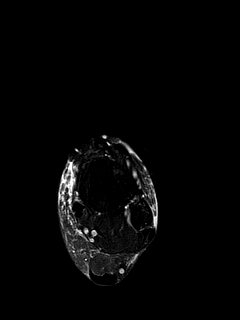
[im 26/30]
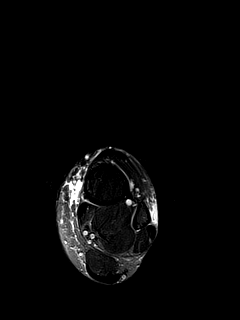
[im 30/30]
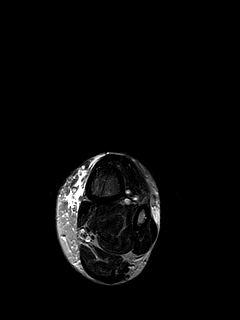

[Series 7: PD fat-sat · axial · 4.0mm · 0.53mm/px · z∈[-63,+82]mm · 9 of 30 slices shown]
[im 1/30]
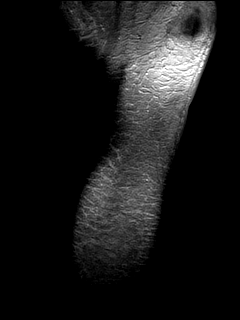
[im 4/30]
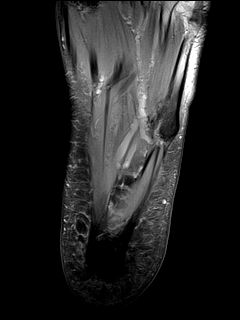
[im 8/30]
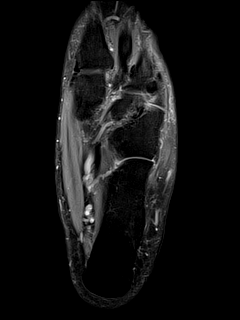
[im 11/30]
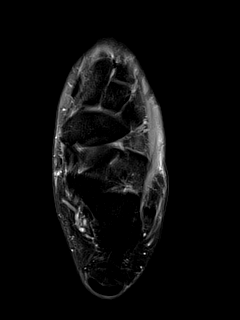
[im 15/30]
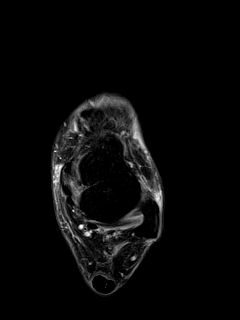
[im 19/30]
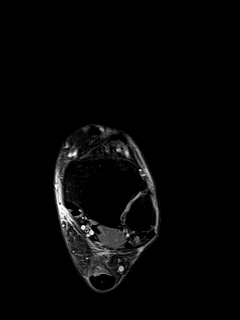
[im 22/30]
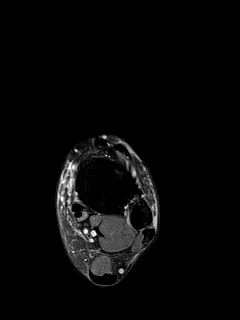
[im 26/30]
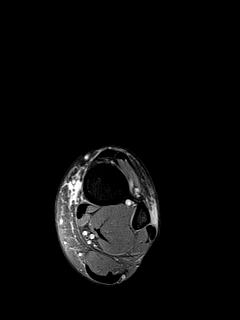
[im 30/30]
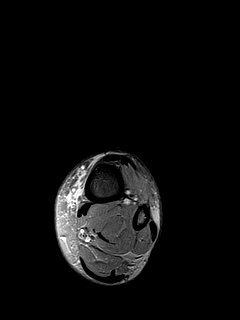

[Series 8: T2 fat-sat · coronal · 4.0mm · 0.33mm/px · 6 of 26 slices shown (3 of 3)]
[im 1/26]
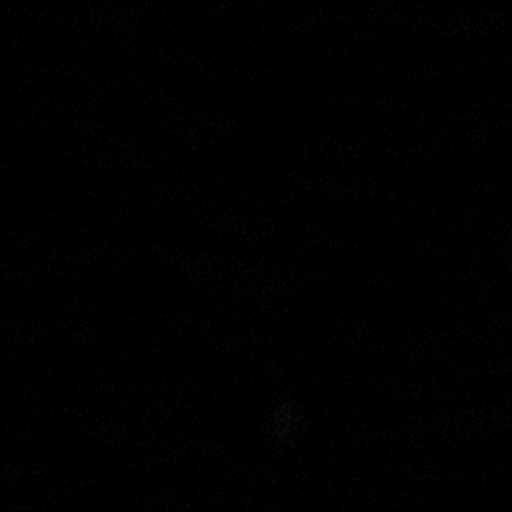
[im 4/26]
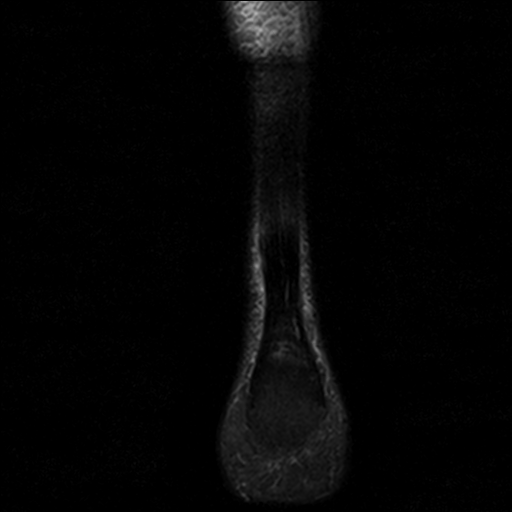
[im 8/26]
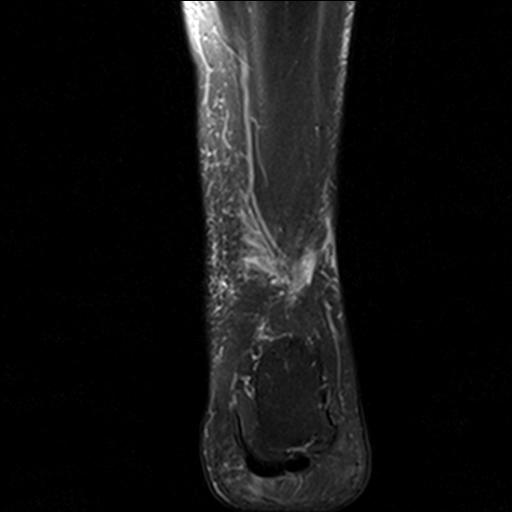
[im 11/26]
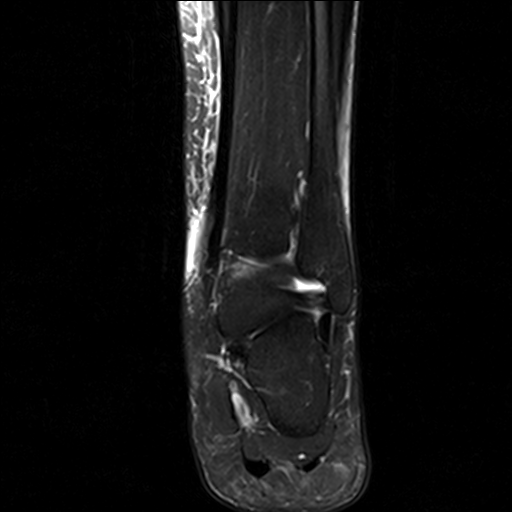
[im 15/26]
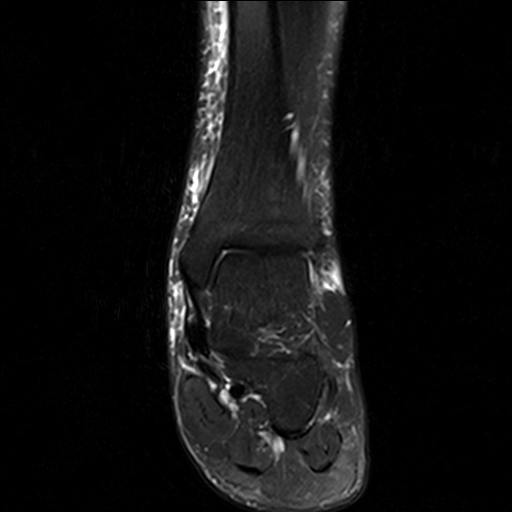
[im 22/26]
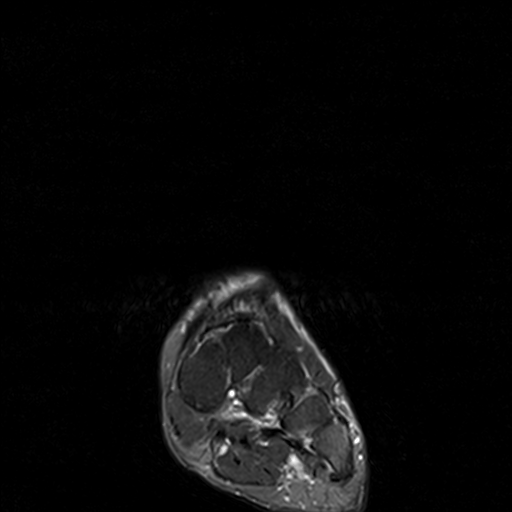

[31 of 40 positions shown; findings below may reference images not displayed]

FINDINGS: TENDONS

Peroneal: Intact.

Posteromedial: Intact.

Anterior: Intact.

Achilles: There is thickening and mildly increased T2 signal in the
distal 5 cm of the Achilles tendon. Tendon thickening and increased
signal are worst approximately 3 cm above the tendon's insertion on
the calcaneus. The tendon is intact.

Plantar Fascia: Demonstrates normal signal. Small plantar calcaneal
spur is noted.

LIGAMENTS

Lateral: Intact.

Medial: Intact.

CARTILAGE

Ankle Joint: Unremarkable.

Subtalar Joints/Sinus Tarsi: Appears normal.

Bones: Normal marrow signal throughout.

Other: None.
IMPRESSION: Noninsertional Achilles tendinopathy without tear. The examination
is otherwise negative.

## 2018-05-18 DIAGNOSIS — E559 Vitamin D deficiency, unspecified: Secondary | ICD-10-CM | POA: Diagnosis not present

## 2018-05-18 DIAGNOSIS — E7800 Pure hypercholesterolemia, unspecified: Secondary | ICD-10-CM | POA: Diagnosis not present

## 2018-07-12 ENCOUNTER — Encounter: Payer: Federal, State, Local not specified - PPO | Admitting: Obstetrics & Gynecology

## 2018-08-30 DIAGNOSIS — E7800 Pure hypercholesterolemia, unspecified: Secondary | ICD-10-CM | POA: Diagnosis not present

## 2018-08-30 DIAGNOSIS — I10 Essential (primary) hypertension: Secondary | ICD-10-CM | POA: Diagnosis not present

## 2018-08-30 DIAGNOSIS — Z23 Encounter for immunization: Secondary | ICD-10-CM | POA: Diagnosis not present

## 2018-08-30 DIAGNOSIS — Z0000 Encounter for general adult medical examination without abnormal findings: Secondary | ICD-10-CM | POA: Diagnosis not present

## 2018-08-30 DIAGNOSIS — E559 Vitamin D deficiency, unspecified: Secondary | ICD-10-CM | POA: Diagnosis not present

## 2018-09-01 DIAGNOSIS — R7301 Impaired fasting glucose: Secondary | ICD-10-CM | POA: Diagnosis not present

## 2018-09-09 ENCOUNTER — Encounter: Payer: Self-pay | Admitting: Obstetrics & Gynecology

## 2018-09-09 ENCOUNTER — Ambulatory Visit (INDEPENDENT_AMBULATORY_CARE_PROVIDER_SITE_OTHER): Payer: Medicare Other | Admitting: Obstetrics & Gynecology

## 2018-09-09 ENCOUNTER — Other Ambulatory Visit: Payer: Self-pay

## 2018-09-09 VITALS — BP 130/82 | Ht 64.0 in | Wt 184.0 lb

## 2018-09-09 DIAGNOSIS — M8588 Other specified disorders of bone density and structure, other site: Secondary | ICD-10-CM

## 2018-09-09 DIAGNOSIS — Z780 Asymptomatic menopausal state: Secondary | ICD-10-CM | POA: Diagnosis not present

## 2018-09-09 DIAGNOSIS — Z9079 Acquired absence of other genital organ(s): Secondary | ICD-10-CM | POA: Diagnosis not present

## 2018-09-09 DIAGNOSIS — E6609 Other obesity due to excess calories: Secondary | ICD-10-CM

## 2018-09-09 DIAGNOSIS — Z90710 Acquired absence of both cervix and uterus: Secondary | ICD-10-CM

## 2018-09-09 DIAGNOSIS — Z90722 Acquired absence of ovaries, bilateral: Secondary | ICD-10-CM

## 2018-09-09 DIAGNOSIS — Z01419 Encounter for gynecological examination (general) (routine) without abnormal findings: Secondary | ICD-10-CM

## 2018-09-09 DIAGNOSIS — Z6831 Body mass index (BMI) 31.0-31.9, adult: Secondary | ICD-10-CM

## 2018-09-09 NOTE — Progress Notes (Signed)
Janet Shelton Mar 16, 1951 [id number]   History:    68 y.o. G1P0A1 Married  RP:  Established patient presenting for annual gyn exam   HPI: Status post hysterectomy and bilateral salpingo-oophorectomy.  No pelvic pain.  Normal vaginal secretions. Occasionally sexually active with husband who lives in [place] to take care of his 42 year old parent. Urine and bowel movements normal.  Breasts normal. Body mass index 31.58.  Has a rescue dog with whom she is walking regularly.  Health labs with family physician.  Past medical history,surgical history, family history and social history were all reviewed and documented in the EPIC chart.  Gynecologic History No LMP recorded. Patient has had a hysterectomy. Contraception: status post hysterectomy Last Pap: 06/2017. Results were: Negative Last mammogram: 02/2018. Results were: Negative Bone Density: 08/2017 Osteopenia at spine T-Score -1.1 Colonoscopy: 2013, 10 yr schedule  Obstetric History OB History  Gravida Para Term Preterm AB Living  1 0       0  SAB TAB Ectopic Multiple Live Births               # Outcome Date GA Lbr Len/2nd Weight Sex Delivery Anes PTL Lv  1 Gravida              ROS: A ROS was performed and pertinent positives and negatives are included in the history.  GENERAL: No fevers or chills. HEENT: No change in vision, no earache, sore throat or sinus congestion. NECK: No pain or stiffness. CARDIOVASCULAR: No chest pain or pressure. No palpitations. PULMONARY: No shortness of breath, cough or wheeze. GASTROINTESTINAL: No abdominal pain, nausea, vomiting or diarrhea, melena or bright red blood per rectum. GENITOURINARY: No urinary frequency, urgency, hesitancy or dysuria. MUSCULOSKELETAL: No joint or muscle pain, no back pain, no recent trauma. DERMATOLOGIC: No rash, no itching, no lesions. ENDOCRINE: No polyuria, polydipsia, no heat or cold intolerance. No recent change in weight. HEMATOLOGICAL: No anemia or easy  bruising or bleeding. NEUROLOGIC: No headache, seizures, numbness, tingling or weakness. PSYCHIATRIC: No depression, no loss of interest in normal activity or change in sleep pattern.     Exam:   BP 130/82   Ht 5\' 4"  (1.626 m)   Wt 184 lb (83.5 kg)   BMI 31.58 kg/m   Body mass index is 31.58 kg/m.  General appearance : Well developed well nourished female. No acute distress HEENT: Eyes: no retinal hemorrhage or exudates,  Neck supple, trachea midline, no carotid bruits, no thyroidmegaly Lungs: Clear to auscultation, no rhonchi or wheezes, or rib retractions  Heart: Regular rate and rhythm, no murmurs or gallops Breast:Examined in sitting and supine position were symmetrical in appearance, no palpable masses or tenderness,  no skin retraction, no nipple inversion, no nipple discharge, no skin discoloration, no axillary or supraclavicular lymphadenopathy Abdomen: no palpable masses or tenderness, no rebound or guarding Extremities: no edema or skin discoloration or tenderness  Pelvic: Vulva: Normal             Vagina: No gross lesions or discharge  Cervix/Uterus absent  Adnexa  Without masses or tenderness  Anus: Normal   Assessment/Plan:  69 y.o. female for annual exam   1. Well female exam with routine gynecological exam Gynecologic exam status post TAH/BSO in menopause.  Pap test January 2019 was negative, no indication to repeat.  Breast exam normal.  Last mammogram September 2019 was negative.  Health labs with family physician.  2. S/P TAH-BSO  3. Postmenopausal Postmenopausal, well on no  hormone replacement therapy.  4. Osteopenia of lumbar spine Very mild osteopenia on bone density March 2019.  Could repeat a bone density at 3 years.  Vitamin D supplements, calcium intake 1.2 to 1.5 g/day, regular weightbearing physical activities.  5. Class 1 obesity due to excess calories without serious comorbidity with body mass index (BMI) of 31.0 to 31.9 in adult Recommend  lower calorie/carb diet such as [employer].  Aerobic physical activities 5 times a week and weightlifting every 2 days.  Princess Bruins MD, 11:05 AM 09/09/2018

## 2018-09-12 ENCOUNTER — Encounter: Payer: Self-pay | Admitting: Obstetrics & Gynecology

## 2018-09-12 NOTE — Patient Instructions (Signed)
1. Well female exam with routine gynecological exam Gynecologic exam status post TAH/BSO in menopause.  Pap test January 2019 was negative, no indication to repeat.  Breast exam normal.  Last mammogram September 2019 was negative.  Health labs with family physician.  2. S/P TAH-BSO  3. Postmenopausal Postmenopausal, well on no hormone replacement therapy.  4. Osteopenia of lumbar spine Very mild osteopenia on bone density March 2019.  Could repeat a bone density at 3 years.  Vitamin D supplements, calcium intake 1.2 to 1.5 g/day, regular weightbearing physical activities.  5. Class 1 obesity due to excess calories without serious comorbidity with body mass index (BMI) of 31.0 to 31.9 in adult Recommend lower calorie/carb diet such as [employer].  Aerobic physical activities 5 times a week and weightlifting every 2 days.  Adysen, it was a pleasure seeing you today!

## 2019-01-17 DIAGNOSIS — R7303 Prediabetes: Secondary | ICD-10-CM | POA: Diagnosis not present

## 2019-03-04 DIAGNOSIS — Z23 Encounter for immunization: Secondary | ICD-10-CM | POA: Diagnosis not present

## 2019-03-28 ENCOUNTER — Encounter: Payer: Self-pay | Admitting: Obstetrics & Gynecology

## 2019-03-28 DIAGNOSIS — Z1231 Encounter for screening mammogram for malignant neoplasm of breast: Secondary | ICD-10-CM | POA: Diagnosis not present

## 2019-03-29 DIAGNOSIS — H25013 Cortical age-related cataract, bilateral: Secondary | ICD-10-CM | POA: Diagnosis not present

## 2019-03-29 DIAGNOSIS — H18603 Keratoconus, unspecified, bilateral: Secondary | ICD-10-CM | POA: Diagnosis not present

## 2019-03-29 DIAGNOSIS — H40013 Open angle with borderline findings, low risk, bilateral: Secondary | ICD-10-CM | POA: Diagnosis not present

## 2019-03-29 DIAGNOSIS — H52203 Unspecified astigmatism, bilateral: Secondary | ICD-10-CM | POA: Diagnosis not present

## 2019-03-30 ENCOUNTER — Encounter: Payer: Self-pay | Admitting: Gynecology

## 2019-06-08 ENCOUNTER — Other Ambulatory Visit: Payer: Self-pay | Admitting: Obstetrics & Gynecology

## 2019-06-24 DIAGNOSIS — H409 Unspecified glaucoma: Secondary | ICD-10-CM

## 2019-06-24 HISTORY — DX: Unspecified glaucoma: H40.9

## 2019-08-22 DIAGNOSIS — R109 Unspecified abdominal pain: Secondary | ICD-10-CM | POA: Diagnosis not present

## 2019-09-02 DIAGNOSIS — Z0000 Encounter for general adult medical examination without abnormal findings: Secondary | ICD-10-CM | POA: Diagnosis not present

## 2019-09-02 DIAGNOSIS — R7303 Prediabetes: Secondary | ICD-10-CM | POA: Diagnosis not present

## 2019-09-02 DIAGNOSIS — Z1159 Encounter for screening for other viral diseases: Secondary | ICD-10-CM | POA: Diagnosis not present

## 2019-09-02 DIAGNOSIS — J302 Other seasonal allergic rhinitis: Secondary | ICD-10-CM | POA: Diagnosis not present

## 2019-09-02 DIAGNOSIS — I10 Essential (primary) hypertension: Secondary | ICD-10-CM | POA: Diagnosis not present

## 2019-09-02 DIAGNOSIS — E559 Vitamin D deficiency, unspecified: Secondary | ICD-10-CM | POA: Diagnosis not present

## 2019-09-02 DIAGNOSIS — E7800 Pure hypercholesterolemia, unspecified: Secondary | ICD-10-CM | POA: Diagnosis not present

## 2019-09-27 DIAGNOSIS — H40013 Open angle with borderline findings, low risk, bilateral: Secondary | ICD-10-CM | POA: Diagnosis not present

## 2019-10-05 ENCOUNTER — Other Ambulatory Visit: Payer: Self-pay

## 2019-10-06 ENCOUNTER — Encounter: Payer: Self-pay | Admitting: Obstetrics & Gynecology

## 2019-10-06 ENCOUNTER — Ambulatory Visit (INDEPENDENT_AMBULATORY_CARE_PROVIDER_SITE_OTHER): Payer: Medicare Other | Admitting: Obstetrics & Gynecology

## 2019-10-06 VITALS — BP 126/84 | Ht 64.0 in | Wt 180.6 lb

## 2019-10-06 DIAGNOSIS — Z780 Asymptomatic menopausal state: Secondary | ICD-10-CM

## 2019-10-06 DIAGNOSIS — Z6831 Body mass index (BMI) 31.0-31.9, adult: Secondary | ICD-10-CM | POA: Diagnosis not present

## 2019-10-06 DIAGNOSIS — M8588 Other specified disorders of bone density and structure, other site: Secondary | ICD-10-CM

## 2019-10-06 DIAGNOSIS — Z01419 Encounter for gynecological examination (general) (routine) without abnormal findings: Secondary | ICD-10-CM

## 2019-10-06 DIAGNOSIS — E6609 Other obesity due to excess calories: Secondary | ICD-10-CM | POA: Diagnosis not present

## 2019-10-06 DIAGNOSIS — Z9079 Acquired absence of other genital organ(s): Secondary | ICD-10-CM

## 2019-10-06 DIAGNOSIS — Z9189 Other specified personal risk factors, not elsewhere classified: Secondary | ICD-10-CM | POA: Diagnosis not present

## 2019-10-06 DIAGNOSIS — Z90710 Acquired absence of both cervix and uterus: Secondary | ICD-10-CM

## 2019-10-06 NOTE — Patient Instructions (Signed)
1. Well female exam with routine gynecological exam Gynecologic exam status post TAH/BSO.  Pap test in 06/2017 was negative, no indication to repeat at this time.  Breast exam normal.  Screening mammogram October 2020 was negative.  Colonoscopy 2013.  Health labs with family physician.  2. S/P TAH-BSO  3. Postmenopausal Well on no hormone replacement therapy.  4. Osteopenia of lumbar spine Very mild osteopenia on bone density March 2019 with a T score of -1.1 at the spine.  All other sites normal.  Will repeat a bone density next year at 4 years.  Patient is on vitamin D supplements.  Recommend calcium intake of 1200 mg daily.  Continue with regular weightbearing physical activities.  5. Class 1 obesity due to excess calories without serious comorbidity with body mass index (BMI) of 31.0 to 31.9 in adult Recommend a low calorie/carb diet such as [employer].  Intermittent fasting as needed.  Aerobic activities 5 times a week with interval training and light weightlifting every 2 days.  Cannon, it was a pleasure seeing you today!

## 2019-10-06 NOTE — Progress Notes (Signed)
Janet Shelton Dec 10, 1950 [id number]   History:    69 y.o. G1P0A1 Married  RP:  Established patient presenting for annual gyn exam   HPI: Status post hysterectomy and bilateral salpingo-oophorectomy. No pelvic pain. Normal vaginal secretions. Occasionally sexually active with husband who lives in [place] to take care of his 73 year old parent. Urine and bowel movements normal. Breasts normal. Body mass index 31.0. Has a rescue dog with whom she is walking regularly. Health labs with family physician.  Monitoring her sugars.   Past medical history,surgical history, family history and social history were all reviewed and documented in the EPIC chart.  Gynecologic History No LMP recorded. Patient has had a hysterectomy.  Obstetric History OB History  Gravida Para Term Preterm AB Living  1 0       0  SAB TAB Ectopic Multiple Live Births               # Outcome Date GA Lbr Len/2nd Weight Sex Delivery Anes PTL Lv  1 Gravida              ROS: A ROS was performed and pertinent positives and negatives are included in the history.  GENERAL: No fevers or chills. HEENT: No change in vision, no earache, sore throat or sinus congestion. NECK: No pain or stiffness. CARDIOVASCULAR: No chest pain or pressure. No palpitations. PULMONARY: No shortness of breath, cough or wheeze. GASTROINTESTINAL: No abdominal pain, nausea, vomiting or diarrhea, melena or bright red blood per rectum. GENITOURINARY: No urinary frequency, urgency, hesitancy or dysuria. MUSCULOSKELETAL: No joint or muscle pain, no back pain, no recent trauma. DERMATOLOGIC: No rash, no itching, no lesions. ENDOCRINE: No polyuria, polydipsia, no heat or cold intolerance. No recent change in weight. HEMATOLOGICAL: No anemia or easy bruising or bleeding. NEUROLOGIC: No headache, seizures, numbness, tingling or weakness. PSYCHIATRIC: No depression, no loss of interest in normal activity or change in sleep pattern.      Exam:   BP 126/84   Ht 5\' 4"  (1.626 m)   Wt 180 lb 9.6 oz (81.9 kg)   BMI 31.00 kg/m   Body mass index is 31 kg/m.  General appearance : Well developed well nourished female. No acute distress HEENT: Eyes: no retinal hemorrhage or exudates,  Neck supple, trachea midline, no carotid bruits, no thyroidmegaly Lungs: Clear to auscultation, no rhonchi or wheezes, or rib retractions  Heart: Regular rate and rhythm, no murmurs or gallops Breast:Examined in sitting and supine position were symmetrical in appearance, no palpable masses or tenderness,  no skin retraction, no nipple inversion, no nipple discharge, no skin discoloration, no axillary or supraclavicular lymphadenopathy Abdomen: no palpable masses or tenderness, no rebound or guarding Extremities: no edema or skin discoloration or tenderness  Pelvic: Vulva: Normal             Vagina: No gross lesions or discharge  Cervix/Uterus absent  Adnexa  Without masses or tenderness  Anus: Normal   Assessment/Plan:  69 y.o. female for annual exam   1. Well female exam with routine gynecological exam Gynecologic exam status post TAH/BSO.  Pap test in 06/2017 was negative, no indication to repeat at this time.  Breast exam normal.  Screening mammogram October 2020 was negative.  Colonoscopy 2013.  Health labs with family physician.  2. S/P TAH-BSO  3. Postmenopausal Well on no hormone replacement therapy.  4. Osteopenia of lumbar spine Very mild osteopenia on bone density March 2019 with a T score of -1.1 at  the spine.  All other sites normal.  Will repeat a bone density next year at 4 years.  Patient is on vitamin D supplements.  Recommend calcium intake of 1200 mg daily.  Continue with regular weightbearing physical activities.  5. Class 1 obesity due to excess calories without serious comorbidity with body mass index (BMI) of 31.0 to 31.9 in adult Recommend a low calorie/carb diet such as [employer].  Intermittent fasting as  needed.  Aerobic activities 5 times a week with interval training and light weightlifting every 2 days.  Princess Bruins MD, 8:22 AM 10/06/2019

## 2019-10-26 DIAGNOSIS — Z713 Dietary counseling and surveillance: Secondary | ICD-10-CM | POA: Diagnosis not present

## 2019-11-22 DIAGNOSIS — Z713 Dietary counseling and surveillance: Secondary | ICD-10-CM | POA: Diagnosis not present

## 2020-01-24 DIAGNOSIS — M25561 Pain in right knee: Secondary | ICD-10-CM | POA: Diagnosis not present

## 2020-03-06 DIAGNOSIS — E7800 Pure hypercholesterolemia, unspecified: Secondary | ICD-10-CM | POA: Diagnosis not present

## 2020-03-06 DIAGNOSIS — Z23 Encounter for immunization: Secondary | ICD-10-CM | POA: Diagnosis not present

## 2020-03-06 DIAGNOSIS — I10 Essential (primary) hypertension: Secondary | ICD-10-CM | POA: Diagnosis not present

## 2020-03-06 DIAGNOSIS — E559 Vitamin D deficiency, unspecified: Secondary | ICD-10-CM | POA: Diagnosis not present

## 2020-03-06 DIAGNOSIS — R7303 Prediabetes: Secondary | ICD-10-CM | POA: Diagnosis not present

## 2020-03-30 DIAGNOSIS — H43813 Vitreous degeneration, bilateral: Secondary | ICD-10-CM | POA: Diagnosis not present

## 2020-03-30 DIAGNOSIS — H25013 Cortical age-related cataract, bilateral: Secondary | ICD-10-CM | POA: Diagnosis not present

## 2020-03-30 DIAGNOSIS — H401131 Primary open-angle glaucoma, bilateral, mild stage: Secondary | ICD-10-CM | POA: Diagnosis not present

## 2020-03-30 DIAGNOSIS — H52203 Unspecified astigmatism, bilateral: Secondary | ICD-10-CM | POA: Diagnosis not present

## 2020-05-04 DIAGNOSIS — H401131 Primary open-angle glaucoma, bilateral, mild stage: Secondary | ICD-10-CM | POA: Diagnosis not present

## 2020-06-07 DIAGNOSIS — E559 Vitamin D deficiency, unspecified: Secondary | ICD-10-CM | POA: Diagnosis not present

## 2020-06-07 DIAGNOSIS — R7309 Other abnormal glucose: Secondary | ICD-10-CM | POA: Diagnosis not present

## 2020-06-07 DIAGNOSIS — E7800 Pure hypercholesterolemia, unspecified: Secondary | ICD-10-CM | POA: Diagnosis not present

## 2020-06-07 DIAGNOSIS — R7303 Prediabetes: Secondary | ICD-10-CM | POA: Diagnosis not present

## 2020-06-23 HISTORY — PX: CATARACT EXTRACTION, BILATERAL: SHX1313

## 2020-07-11 DIAGNOSIS — H18603 Keratoconus, unspecified, bilateral: Secondary | ICD-10-CM | POA: Diagnosis not present

## 2020-07-11 DIAGNOSIS — H25813 Combined forms of age-related cataract, bilateral: Secondary | ICD-10-CM | POA: Diagnosis not present

## 2020-09-05 DIAGNOSIS — H401131 Primary open-angle glaucoma, bilateral, mild stage: Secondary | ICD-10-CM | POA: Diagnosis not present

## 2020-10-09 ENCOUNTER — Encounter: Payer: Federal, State, Local not specified - PPO | Admitting: Obstetrics & Gynecology

## 2020-10-15 ENCOUNTER — Ambulatory Visit: Payer: Federal, State, Local not specified - PPO | Admitting: Obstetrics & Gynecology

## 2020-11-02 DIAGNOSIS — Z0000 Encounter for general adult medical examination without abnormal findings: Secondary | ICD-10-CM | POA: Diagnosis not present

## 2020-11-02 DIAGNOSIS — Z1389 Encounter for screening for other disorder: Secondary | ICD-10-CM | POA: Diagnosis not present

## 2020-11-06 DIAGNOSIS — I10 Essential (primary) hypertension: Secondary | ICD-10-CM | POA: Diagnosis not present

## 2020-11-06 DIAGNOSIS — J302 Other seasonal allergic rhinitis: Secondary | ICD-10-CM | POA: Diagnosis not present

## 2020-11-06 DIAGNOSIS — E559 Vitamin D deficiency, unspecified: Secondary | ICD-10-CM | POA: Diagnosis not present

## 2020-11-06 DIAGNOSIS — Z23 Encounter for immunization: Secondary | ICD-10-CM | POA: Diagnosis not present

## 2020-11-06 DIAGNOSIS — R238 Other skin changes: Secondary | ICD-10-CM | POA: Diagnosis not present

## 2020-11-06 DIAGNOSIS — E7800 Pure hypercholesterolemia, unspecified: Secondary | ICD-10-CM | POA: Diagnosis not present

## 2020-11-06 DIAGNOSIS — R739 Hyperglycemia, unspecified: Secondary | ICD-10-CM | POA: Diagnosis not present

## 2020-11-09 DIAGNOSIS — H524 Presbyopia: Secondary | ICD-10-CM | POA: Diagnosis not present

## 2020-11-09 DIAGNOSIS — H2513 Age-related nuclear cataract, bilateral: Secondary | ICD-10-CM | POA: Diagnosis not present

## 2020-11-09 DIAGNOSIS — H401131 Primary open-angle glaucoma, bilateral, mild stage: Secondary | ICD-10-CM | POA: Diagnosis not present

## 2020-11-09 DIAGNOSIS — H25013 Cortical age-related cataract, bilateral: Secondary | ICD-10-CM | POA: Diagnosis not present

## 2020-11-30 ENCOUNTER — Other Ambulatory Visit: Payer: Self-pay

## 2020-12-03 ENCOUNTER — Other Ambulatory Visit: Payer: Self-pay

## 2020-12-03 ENCOUNTER — Ambulatory Visit (INDEPENDENT_AMBULATORY_CARE_PROVIDER_SITE_OTHER): Payer: Medicare Other | Admitting: Obstetrics & Gynecology

## 2020-12-03 ENCOUNTER — Encounter: Payer: Self-pay | Admitting: Obstetrics & Gynecology

## 2020-12-03 VITALS — BP 130/90 | Ht 65.0 in | Wt 183.0 lb

## 2020-12-03 DIAGNOSIS — Z6830 Body mass index (BMI) 30.0-30.9, adult: Secondary | ICD-10-CM

## 2020-12-03 DIAGNOSIS — Z90710 Acquired absence of both cervix and uterus: Secondary | ICD-10-CM

## 2020-12-03 DIAGNOSIS — Z9189 Other specified personal risk factors, not elsewhere classified: Secondary | ICD-10-CM | POA: Diagnosis not present

## 2020-12-03 DIAGNOSIS — M8588 Other specified disorders of bone density and structure, other site: Secondary | ICD-10-CM

## 2020-12-03 DIAGNOSIS — E6609 Other obesity due to excess calories: Secondary | ICD-10-CM

## 2020-12-03 DIAGNOSIS — Z01419 Encounter for gynecological examination (general) (routine) without abnormal findings: Secondary | ICD-10-CM | POA: Diagnosis not present

## 2020-12-03 DIAGNOSIS — Z780 Asymptomatic menopausal state: Secondary | ICD-10-CM

## 2020-12-03 DIAGNOSIS — Z90722 Acquired absence of ovaries, bilateral: Secondary | ICD-10-CM

## 2020-12-03 DIAGNOSIS — Z9079 Acquired absence of other genital organ(s): Secondary | ICD-10-CM

## 2020-12-03 NOTE — Progress Notes (Signed)
Janet Shelton Mar 25, 1951 [id number]   History:    70 y.o.  G1P0A1 Married   RP:  Established patient presenting for annual gyn exam   HPI: Status post hysterectomy and bilateral salpingo-oophorectomy. Pap Neg 2019.  No pelvic pain.  Normal vaginal secretions.  Abstinent.  Husband had Prostate Ca and lives in [place] to take care of his 61 year old parent. Urine and bowel movements normal.  Breasts normal.  Body mass index 30.45.  Has a rescue dog with whom she is walking regularly.  Health labs with family physician.  Monitoring her sugars. Colono 2013.  BD 05/2018 Osteopenia T-Score -1.1 at Spine (other sites normal).    Past medical history,surgical history, family history and social history were all reviewed and documented in the EPIC chart.  Gynecologic History No LMP recorded. Patient has had a hysterectomy.  Obstetric History OB History  Gravida Para Term Preterm AB Living  1 0       0  SAB IAB Ectopic Multiple Live Births               # Outcome Date GA Lbr Len/2nd Weight Sex Delivery Anes PTL Lv  1 Gravida              ROS: A ROS was performed and pertinent positives and negatives are included in the history.  GENERAL: No fevers or chills. HEENT: No change in vision, no earache, sore throat or sinus congestion. NECK: No pain or stiffness. CARDIOVASCULAR: No chest pain or pressure. No palpitations. PULMONARY: No shortness of breath, cough or wheeze. GASTROINTESTINAL: No abdominal pain, nausea, vomiting or diarrhea, melena or bright red blood per rectum. GENITOURINARY: No urinary frequency, urgency, hesitancy or dysuria. MUSCULOSKELETAL: No joint or muscle pain, no back pain, no recent trauma. DERMATOLOGIC: No rash, no itching, no lesions. ENDOCRINE: No polyuria, polydipsia, no heat or cold intolerance. No recent change in weight. HEMATOLOGICAL: No anemia or easy bruising or bleeding. NEUROLOGIC: No headache, seizures, numbness, tingling or weakness. PSYCHIATRIC: No  depression, no loss of interest in normal activity or change in sleep pattern.     Exam:   BP 130/90   Ht 5\' 5"  (1.651 m)   Wt 183 lb (83 kg)   BMI 30.45 kg/m   Body mass index is 30.45 kg/m.  General appearance : Well developed well nourished female. No acute distress HEENT: Eyes: no retinal hemorrhage or exudates,  Neck supple, trachea midline, no carotid bruits, no thyroidmegaly Lungs: Clear to auscultation, no rhonchi or wheezes, or rib retractions  Heart: Regular rate and rhythm, no murmurs or gallops Breast:Examined in sitting and supine position were symmetrical in appearance, no palpable masses or tenderness,  no skin retraction, no nipple inversion, no nipple discharge, no skin discoloration, no axillary or supraclavicular lymphadenopathy Abdomen: no palpable masses or tenderness, no rebound or guarding Extremities: no edema or skin discoloration or tenderness  Pelvic: Vulva: Normal             Vagina: No gross lesions or discharge  Cervix/Uterus absent  Adnexa  Without masses or tenderness  Anus: Normal   Assessment/Plan:  70 y.o. female for annual exam   1. Well female exam with routine gynecological exam Gynecologic exam status post TAH/BSO within normal.  No indication for Pap test at this time, last Pap test in 2019 was negative.  Breast exam normal.  Screening mammogram in October 2021 was negative.  Colonoscopy in 2013.  Health labs with family physician.  2. S/P  TAH-BSO  3. Postmenopausal Well on no hormone replacement therapy.  4. Osteopenia of lumbar spine Bone density in 2019 was normal except for very mild osteopenia of the lumbar spine with a T score of -1.1.  We will repeat a bone density at 5 years.  Vitamin D supplements, calcium intake of 1.5 g/day total and weightbearing physical activities recommended.  5. Class 1 obesity due to excess calories with serious comorbidity and body mass index (BMI) of 30.0 to 30.9 in adult Recommend a lower  calorie/carb diet.  Patient seen by nutritionist last year.  Will attempt cutting on sugars.  Intermittent fasting suggested.  Aerobic activities 5 times a week and light weightlifting every 2 days recommended.  Other orders - latanoprost (XALATAN) 0.005 % ophthalmic solution; 1 drop at bedtime. - Vitamin D, Cholecalciferol, 25 MCG (1000 UT) TABS; Take 1 capsule by mouth daily.   Princess Bruins MD, 1:44 PM 12/03/2020

## 2020-12-07 ENCOUNTER — Ambulatory Visit: Payer: Federal, State, Local not specified - PPO | Admitting: Obstetrics & Gynecology

## 2020-12-20 ENCOUNTER — Ambulatory Visit: Payer: Federal, State, Local not specified - PPO | Admitting: Obstetrics & Gynecology

## 2021-01-31 DIAGNOSIS — H52201 Unspecified astigmatism, right eye: Secondary | ICD-10-CM | POA: Diagnosis not present

## 2021-01-31 DIAGNOSIS — H2511 Age-related nuclear cataract, right eye: Secondary | ICD-10-CM | POA: Diagnosis not present

## 2021-01-31 DIAGNOSIS — H25011 Cortical age-related cataract, right eye: Secondary | ICD-10-CM | POA: Diagnosis not present

## 2021-01-31 DIAGNOSIS — H25811 Combined forms of age-related cataract, right eye: Secondary | ICD-10-CM | POA: Diagnosis not present

## 2021-03-07 DIAGNOSIS — H52222 Regular astigmatism, left eye: Secondary | ICD-10-CM | POA: Diagnosis not present

## 2021-03-07 DIAGNOSIS — H2512 Age-related nuclear cataract, left eye: Secondary | ICD-10-CM | POA: Diagnosis not present

## 2021-03-07 DIAGNOSIS — H25812 Combined forms of age-related cataract, left eye: Secondary | ICD-10-CM | POA: Diagnosis not present

## 2021-03-07 DIAGNOSIS — H25012 Cortical age-related cataract, left eye: Secondary | ICD-10-CM | POA: Diagnosis not present

## 2021-03-19 DIAGNOSIS — R739 Hyperglycemia, unspecified: Secondary | ICD-10-CM | POA: Diagnosis not present

## 2021-03-19 DIAGNOSIS — Z23 Encounter for immunization: Secondary | ICD-10-CM | POA: Diagnosis not present

## 2021-03-19 DIAGNOSIS — E7800 Pure hypercholesterolemia, unspecified: Secondary | ICD-10-CM | POA: Diagnosis not present

## 2021-03-19 DIAGNOSIS — I10 Essential (primary) hypertension: Secondary | ICD-10-CM | POA: Diagnosis not present

## 2021-03-19 DIAGNOSIS — J302 Other seasonal allergic rhinitis: Secondary | ICD-10-CM | POA: Diagnosis not present

## 2021-03-19 DIAGNOSIS — E559 Vitamin D deficiency, unspecified: Secondary | ICD-10-CM | POA: Diagnosis not present

## 2021-03-19 DIAGNOSIS — Z1211 Encounter for screening for malignant neoplasm of colon: Secondary | ICD-10-CM | POA: Diagnosis not present

## 2021-04-04 DIAGNOSIS — Z1231 Encounter for screening mammogram for malignant neoplasm of breast: Secondary | ICD-10-CM | POA: Diagnosis not present

## 2021-04-05 ENCOUNTER — Encounter: Payer: Self-pay | Admitting: Obstetrics & Gynecology

## 2021-05-22 DIAGNOSIS — H2013 Chronic iridocyclitis, bilateral: Secondary | ICD-10-CM | POA: Diagnosis not present

## 2021-06-04 DIAGNOSIS — E7800 Pure hypercholesterolemia, unspecified: Secondary | ICD-10-CM | POA: Diagnosis not present

## 2021-06-04 DIAGNOSIS — H2013 Chronic iridocyclitis, bilateral: Secondary | ICD-10-CM | POA: Diagnosis not present

## 2021-06-04 DIAGNOSIS — E559 Vitamin D deficiency, unspecified: Secondary | ICD-10-CM | POA: Diagnosis not present

## 2021-06-12 DIAGNOSIS — H2013 Chronic iridocyclitis, bilateral: Secondary | ICD-10-CM | POA: Diagnosis not present

## 2021-07-02 DIAGNOSIS — H2013 Chronic iridocyclitis, bilateral: Secondary | ICD-10-CM | POA: Diagnosis not present

## 2021-08-30 DIAGNOSIS — H4053X0 Glaucoma secondary to other eye disorders, bilateral, stage unspecified: Secondary | ICD-10-CM | POA: Diagnosis not present

## 2021-08-30 DIAGNOSIS — H2013 Chronic iridocyclitis, bilateral: Secondary | ICD-10-CM | POA: Diagnosis not present

## 2021-09-03 DIAGNOSIS — H2013 Chronic iridocyclitis, bilateral: Secondary | ICD-10-CM | POA: Diagnosis not present

## 2021-09-03 DIAGNOSIS — H4053X2 Glaucoma secondary to other eye disorders, bilateral, moderate stage: Secondary | ICD-10-CM | POA: Diagnosis not present

## 2021-09-16 DIAGNOSIS — H209 Unspecified iridocyclitis: Secondary | ICD-10-CM | POA: Diagnosis not present

## 2021-09-16 DIAGNOSIS — H35373 Puckering of macula, bilateral: Secondary | ICD-10-CM | POA: Diagnosis not present

## 2021-09-16 DIAGNOSIS — Z961 Presence of intraocular lens: Secondary | ICD-10-CM | POA: Diagnosis not present

## 2021-09-16 DIAGNOSIS — Z1159 Encounter for screening for other viral diseases: Secondary | ICD-10-CM | POA: Diagnosis not present

## 2021-09-16 DIAGNOSIS — H18603 Keratoconus, unspecified, bilateral: Secondary | ICD-10-CM | POA: Diagnosis not present

## 2021-09-16 DIAGNOSIS — Z205 Contact with and (suspected) exposure to viral hepatitis: Secondary | ICD-10-CM | POA: Diagnosis not present

## 2021-09-22 DIAGNOSIS — H209 Unspecified iridocyclitis: Secondary | ICD-10-CM | POA: Diagnosis not present

## 2021-10-29 DIAGNOSIS — Z961 Presence of intraocular lens: Secondary | ICD-10-CM | POA: Diagnosis not present

## 2021-10-29 DIAGNOSIS — H35373 Puckering of macula, bilateral: Secondary | ICD-10-CM | POA: Diagnosis not present

## 2021-10-29 DIAGNOSIS — H18603 Keratoconus, unspecified, bilateral: Secondary | ICD-10-CM | POA: Diagnosis not present

## 2021-10-29 DIAGNOSIS — Z79899 Other long term (current) drug therapy: Secondary | ICD-10-CM | POA: Diagnosis not present

## 2021-10-29 DIAGNOSIS — H209 Unspecified iridocyclitis: Secondary | ICD-10-CM | POA: Diagnosis not present

## 2021-10-30 DIAGNOSIS — M7989 Other specified soft tissue disorders: Secondary | ICD-10-CM | POA: Diagnosis not present

## 2021-10-30 DIAGNOSIS — I10 Essential (primary) hypertension: Secondary | ICD-10-CM | POA: Diagnosis not present

## 2021-11-04 DIAGNOSIS — Z1389 Encounter for screening for other disorder: Secondary | ICD-10-CM | POA: Diagnosis not present

## 2021-11-04 DIAGNOSIS — Z0000 Encounter for general adult medical examination without abnormal findings: Secondary | ICD-10-CM | POA: Diagnosis not present

## 2021-11-20 DIAGNOSIS — R739 Hyperglycemia, unspecified: Secondary | ICD-10-CM | POA: Diagnosis not present

## 2021-11-20 DIAGNOSIS — I10 Essential (primary) hypertension: Secondary | ICD-10-CM | POA: Diagnosis not present

## 2021-11-20 DIAGNOSIS — M7989 Other specified soft tissue disorders: Secondary | ICD-10-CM | POA: Diagnosis not present

## 2021-11-21 ENCOUNTER — Other Ambulatory Visit: Payer: Self-pay

## 2021-11-21 MED ORDER — VALACYCLOVIR HCL 500 MG PO TABS: ORAL_TABLET | ORAL | 0 refills | Status: DC

## 2021-11-21 NOTE — Telephone Encounter (Signed)
Last AEX 12/03/20--scheduled for 12/04/21.

## 2021-12-04 ENCOUNTER — Encounter: Payer: Self-pay | Admitting: Obstetrics & Gynecology

## 2021-12-04 ENCOUNTER — Ambulatory Visit (INDEPENDENT_AMBULATORY_CARE_PROVIDER_SITE_OTHER): Payer: Medicare Other | Admitting: Obstetrics & Gynecology

## 2021-12-04 VITALS — BP 110/82 | HR 85 | Ht 64.25 in | Wt 183.0 lb

## 2021-12-04 DIAGNOSIS — Z9189 Other specified personal risk factors, not elsewhere classified: Secondary | ICD-10-CM

## 2021-12-04 DIAGNOSIS — Z01419 Encounter for gynecological examination (general) (routine) without abnormal findings: Secondary | ICD-10-CM

## 2021-12-04 DIAGNOSIS — Z90710 Acquired absence of both cervix and uterus: Secondary | ICD-10-CM

## 2021-12-04 DIAGNOSIS — Z90722 Acquired absence of ovaries, bilateral: Secondary | ICD-10-CM

## 2021-12-04 DIAGNOSIS — Z9079 Acquired absence of other genital organ(s): Secondary | ICD-10-CM

## 2021-12-04 DIAGNOSIS — Z6831 Body mass index (BMI) 31.0-31.9, adult: Secondary | ICD-10-CM

## 2021-12-04 DIAGNOSIS — M8588 Other specified disorders of bone density and structure, other site: Secondary | ICD-10-CM

## 2021-12-04 DIAGNOSIS — B009 Herpesviral infection, unspecified: Secondary | ICD-10-CM

## 2021-12-04 DIAGNOSIS — E6609 Other obesity due to excess calories: Secondary | ICD-10-CM

## 2021-12-04 DIAGNOSIS — Z9289 Personal history of other medical treatment: Secondary | ICD-10-CM

## 2021-12-04 DIAGNOSIS — Z780 Asymptomatic menopausal state: Secondary | ICD-10-CM

## 2021-12-04 NOTE — Progress Notes (Signed)
Janet Shelton 12-13-1950 [id number]   History:    71 y.o. G1P0A1 Married   RP:  Established patient presenting for annual gyn exam   HPI: Status post hysterectomy and bilateral salpingo-oophorectomy. Postmenopause, well on no HRT.  No pelvic pain. Pap Neg 2019. No indication for a Pap test at this time. Normal vaginal secretions.  Abstinent.  Husband had Prostate Ca and lives in [place]. Urine and bowel movements normal.  Breasts normal. Mammo Neg in 03/2021.  Body mass index 31.17.  Has a rescue dog with whom she is walking regularly.  Joined the Gym, doing [employer]. Weight Watcher.  Health labs with family physician.  Colono to schedule this year.  BD 05/2018 Osteopenia T-Score -1.1 at Spine (other sites normal).   Past medical history,surgical history, family history and social history were all reviewed and documented in the EPIC chart.  Gynecologic History No LMP recorded. Patient has had a hysterectomy.  Obstetric History OB History  Gravida Para Term Preterm AB Living  0 0 0 0 0 0  SAB IAB Ectopic Multiple Live Births  0 0 0 0 0     ROS: A ROS was performed and pertinent positives and negatives are included in the history.  GENERAL: No fevers or chills. HEENT: No change in vision, no earache, sore throat or sinus congestion. NECK: No pain or stiffness. CARDIOVASCULAR: No chest pain or pressure. No palpitations. PULMONARY: No shortness of breath, cough or wheeze. GASTROINTESTINAL: No abdominal pain, nausea, vomiting or diarrhea, melena or bright red blood per rectum. GENITOURINARY: No urinary frequency, urgency, hesitancy or dysuria. MUSCULOSKELETAL: No joint or muscle pain, no back pain, no recent trauma. DERMATOLOGIC: No rash, no itching, no lesions. ENDOCRINE: No polyuria, polydipsia, no heat or cold intolerance. No recent change in weight. HEMATOLOGICAL: No anemia or easy bruising or bleeding. NEUROLOGIC: No headache, seizures, numbness, tingling or weakness.  PSYCHIATRIC: No depression, no loss of interest in normal activity or change in sleep pattern.     Exam:   BP 110/82   Pulse 85   Ht 5' 4.25" (1.632 m)   Wt 183 lb (83 kg)   SpO2 99%   BMI 31.17 kg/m   Body mass index is 31.17 kg/m.  General appearance : Well developed well nourished female. No acute distress HEENT: Eyes: no retinal hemorrhage or exudates,  Neck supple, trachea midline, no carotid bruits, no thyroidmegaly Lungs: Clear to auscultation, no rhonchi or wheezes, or rib retractions  Heart: Regular rate and rhythm, no murmurs or gallops Breast:Examined in sitting and supine position were symmetrical in appearance, no palpable masses or tenderness,  no skin retraction, no nipple inversion, no nipple discharge, no skin discoloration, no axillary or supraclavicular lymphadenopathy Abdomen: no palpable masses or tenderness, no rebound or guarding Extremities: no edema or skin discoloration or tenderness  Pelvic: Vulva: Normal             Vagina: No gross lesions or discharge  Cervix/Uterus absent  Adnexa  Without masses or tenderness  Anus: Normal   Assessment/Plan:  71 y.o. female for annual exam   1. Well female exam with routine gynecological exam Status post hysterectomy and bilateral salpingo-oophorectomy. Postmenopause, well on no HRT.  No pelvic pain. Pap Neg 2019. No indication for a Pap test at this time.  Normal vaginal secretions.  Abstinent.  Husband had Prostate Ca and lives in [place]. Urine and bowel movements normal.  Breasts normal. Mammo Neg in 03/2021.  Body mass  index 31.17.  Has a rescue dog with whom she is walking regularly.  Joined the Gym, doing [employer]. Weight Watcher.  Health labs with family physician.  Colono to schedule this year.  BD 05/2018 Osteopenia T-Score -1.1 at Spine (other sites normal).  2. S/P TAH-BSO  3. Postmenopausal Status post hysterectomy and bilateral salpingo-oophorectomy. Postmenopause, well on no HRT.  No  pelvic pain.  4. Osteopenia of lumbar spine  BD 05/2018 Osteopenia T-Score -1.1 at Spine (other sites normal). Vit D, Ca++ 1.5 g/d total, regular weight bearing activities.  5. Class 1 obesity due to excess calories with serious comorbidity and body mass index (BMI) of 31.0 to 31.9 in adult Body mass index 31.17.  Has a rescue dog with whom she is walking regularly.  Joined the Gym, doing [employer]. Weight Watcher.  6. Personal history of other medical treatment  7. Other specified personal risk factors, not elsewhere classified  Other orders - losartan-hydrochlorothiazide (HYZAAR) 100-12.5 MG tablet; Take by mouth daily. Takes 1/2 - Dorzolamide HCl-Timolol Mal PF 2-0.5 % SOLN; Apply 1 drop to eye 2 (two) times daily. - brimonidine (ALPHAGAN) 0.2 % ophthalmic solution; 1 drop 3 (three) times daily. - azaTHIOprine (IMURAN) 50 MG tablet; Take 50 mg by mouth daily.  Princess Bruins MD, 2:57 PM 12/04/2021

## 2021-12-17 DIAGNOSIS — H2013 Chronic iridocyclitis, bilateral: Secondary | ICD-10-CM | POA: Diagnosis not present

## 2021-12-17 DIAGNOSIS — H4053X2 Glaucoma secondary to other eye disorders, bilateral, moderate stage: Secondary | ICD-10-CM | POA: Diagnosis not present

## 2022-03-04 DIAGNOSIS — H35373 Puckering of macula, bilateral: Secondary | ICD-10-CM | POA: Diagnosis not present

## 2022-03-04 DIAGNOSIS — Z961 Presence of intraocular lens: Secondary | ICD-10-CM | POA: Diagnosis not present

## 2022-03-04 DIAGNOSIS — H18603 Keratoconus, unspecified, bilateral: Secondary | ICD-10-CM | POA: Diagnosis not present

## 2022-03-04 DIAGNOSIS — H209 Unspecified iridocyclitis: Secondary | ICD-10-CM | POA: Diagnosis not present

## 2022-03-04 DIAGNOSIS — Z79899 Other long term (current) drug therapy: Secondary | ICD-10-CM | POA: Diagnosis not present

## 2022-03-10 DIAGNOSIS — H209 Unspecified iridocyclitis: Secondary | ICD-10-CM | POA: Diagnosis not present

## 2022-03-10 DIAGNOSIS — Z79899 Other long term (current) drug therapy: Secondary | ICD-10-CM | POA: Diagnosis not present

## 2022-04-04 DIAGNOSIS — M25561 Pain in right knee: Secondary | ICD-10-CM | POA: Diagnosis not present

## 2022-04-04 DIAGNOSIS — M1711 Unilateral primary osteoarthritis, right knee: Secondary | ICD-10-CM | POA: Diagnosis not present

## 2022-04-14 DIAGNOSIS — Z1231 Encounter for screening mammogram for malignant neoplasm of breast: Secondary | ICD-10-CM | POA: Diagnosis not present

## 2022-04-17 ENCOUNTER — Encounter: Payer: Self-pay | Admitting: Obstetrics & Gynecology

## 2022-04-17 DIAGNOSIS — Z23 Encounter for immunization: Secondary | ICD-10-CM | POA: Diagnosis not present

## 2022-05-20 DIAGNOSIS — I10 Essential (primary) hypertension: Secondary | ICD-10-CM | POA: Diagnosis not present

## 2022-05-20 DIAGNOSIS — H52203 Unspecified astigmatism, bilateral: Secondary | ICD-10-CM | POA: Diagnosis not present

## 2022-05-20 DIAGNOSIS — R739 Hyperglycemia, unspecified: Secondary | ICD-10-CM | POA: Diagnosis not present

## 2022-05-20 DIAGNOSIS — H524 Presbyopia: Secondary | ICD-10-CM | POA: Diagnosis not present

## 2022-05-20 DIAGNOSIS — E7800 Pure hypercholesterolemia, unspecified: Secondary | ICD-10-CM | POA: Diagnosis not present

## 2022-05-20 DIAGNOSIS — H401131 Primary open-angle glaucoma, bilateral, mild stage: Secondary | ICD-10-CM | POA: Diagnosis not present

## 2022-05-20 DIAGNOSIS — H04123 Dry eye syndrome of bilateral lacrimal glands: Secondary | ICD-10-CM | POA: Diagnosis not present

## 2022-05-20 DIAGNOSIS — Z6831 Body mass index (BMI) 31.0-31.9, adult: Secondary | ICD-10-CM | POA: Diagnosis not present

## 2022-05-20 DIAGNOSIS — Z961 Presence of intraocular lens: Secondary | ICD-10-CM | POA: Diagnosis not present

## 2022-05-20 DIAGNOSIS — E559 Vitamin D deficiency, unspecified: Secondary | ICD-10-CM | POA: Diagnosis not present

## 2022-05-20 DIAGNOSIS — H43813 Vitreous degeneration, bilateral: Secondary | ICD-10-CM | POA: Diagnosis not present

## 2022-05-21 DIAGNOSIS — E7800 Pure hypercholesterolemia, unspecified: Secondary | ICD-10-CM | POA: Diagnosis not present

## 2022-05-21 DIAGNOSIS — I10 Essential (primary) hypertension: Secondary | ICD-10-CM | POA: Diagnosis not present

## 2022-05-21 DIAGNOSIS — E559 Vitamin D deficiency, unspecified: Secondary | ICD-10-CM | POA: Diagnosis not present

## 2022-05-21 DIAGNOSIS — R739 Hyperglycemia, unspecified: Secondary | ICD-10-CM | POA: Diagnosis not present

## 2022-05-30 DIAGNOSIS — Z20822 Contact with and (suspected) exposure to covid-19: Secondary | ICD-10-CM | POA: Diagnosis not present

## 2022-05-30 DIAGNOSIS — Z6830 Body mass index (BMI) 30.0-30.9, adult: Secondary | ICD-10-CM | POA: Diagnosis not present

## 2022-05-30 DIAGNOSIS — R195 Other fecal abnormalities: Secondary | ICD-10-CM | POA: Diagnosis not present

## 2022-05-30 DIAGNOSIS — R051 Acute cough: Secondary | ICD-10-CM | POA: Diagnosis not present

## 2022-06-03 ENCOUNTER — Encounter: Payer: Self-pay | Admitting: Gastroenterology

## 2022-06-10 DIAGNOSIS — Z79899 Other long term (current) drug therapy: Secondary | ICD-10-CM | POA: Diagnosis not present

## 2022-06-10 DIAGNOSIS — Z961 Presence of intraocular lens: Secondary | ICD-10-CM | POA: Diagnosis not present

## 2022-06-10 DIAGNOSIS — H18603 Keratoconus, unspecified, bilateral: Secondary | ICD-10-CM | POA: Diagnosis not present

## 2022-06-10 DIAGNOSIS — H35373 Puckering of macula, bilateral: Secondary | ICD-10-CM | POA: Diagnosis not present

## 2022-06-10 DIAGNOSIS — H209 Unspecified iridocyclitis: Secondary | ICD-10-CM | POA: Diagnosis not present

## 2022-06-24 DIAGNOSIS — Z6830 Body mass index (BMI) 30.0-30.9, adult: Secondary | ICD-10-CM | POA: Diagnosis not present

## 2022-06-24 DIAGNOSIS — M25512 Pain in left shoulder: Secondary | ICD-10-CM | POA: Diagnosis not present

## 2022-07-02 ENCOUNTER — Ambulatory Visit (AMBULATORY_SURGERY_CENTER): Payer: Federal, State, Local not specified - PPO

## 2022-07-02 VITALS — Ht 64.0 in | Wt 182.0 lb

## 2022-07-02 DIAGNOSIS — Z1211 Encounter for screening for malignant neoplasm of colon: Secondary | ICD-10-CM

## 2022-07-02 MED ORDER — NA SULFATE-K SULFATE-MG SULF 17.5-3.13-1.6 GM/177ML PO SOLN: 1.0000 | Freq: Once | ORAL | 0 refills | Status: AC

## 2022-07-02 NOTE — Progress Notes (Signed)

## 2022-07-07 DIAGNOSIS — M7502 Adhesive capsulitis of left shoulder: Secondary | ICD-10-CM | POA: Diagnosis not present

## 2022-07-14 DIAGNOSIS — M7502 Adhesive capsulitis of left shoulder: Secondary | ICD-10-CM | POA: Diagnosis not present

## 2022-07-14 DIAGNOSIS — M25612 Stiffness of left shoulder, not elsewhere classified: Secondary | ICD-10-CM | POA: Diagnosis not present

## 2022-07-16 DIAGNOSIS — M25612 Stiffness of left shoulder, not elsewhere classified: Secondary | ICD-10-CM | POA: Diagnosis not present

## 2022-07-16 DIAGNOSIS — M7502 Adhesive capsulitis of left shoulder: Secondary | ICD-10-CM | POA: Diagnosis not present

## 2022-07-18 DIAGNOSIS — M25612 Stiffness of left shoulder, not elsewhere classified: Secondary | ICD-10-CM | POA: Diagnosis not present

## 2022-07-18 DIAGNOSIS — M7502 Adhesive capsulitis of left shoulder: Secondary | ICD-10-CM | POA: Diagnosis not present

## 2022-07-21 DIAGNOSIS — M25612 Stiffness of left shoulder, not elsewhere classified: Secondary | ICD-10-CM | POA: Diagnosis not present

## 2022-07-21 DIAGNOSIS — M7502 Adhesive capsulitis of left shoulder: Secondary | ICD-10-CM | POA: Diagnosis not present

## 2022-07-23 DIAGNOSIS — M25612 Stiffness of left shoulder, not elsewhere classified: Secondary | ICD-10-CM | POA: Diagnosis not present

## 2022-07-23 DIAGNOSIS — M7502 Adhesive capsulitis of left shoulder: Secondary | ICD-10-CM | POA: Diagnosis not present

## 2022-07-24 ENCOUNTER — Encounter: Payer: Federal, State, Local not specified - PPO | Admitting: Gastroenterology

## 2022-07-24 DIAGNOSIS — M7502 Adhesive capsulitis of left shoulder: Secondary | ICD-10-CM | POA: Diagnosis not present

## 2022-07-24 DIAGNOSIS — M25612 Stiffness of left shoulder, not elsewhere classified: Secondary | ICD-10-CM | POA: Diagnosis not present

## 2022-07-28 ENCOUNTER — Encounter: Payer: Self-pay | Admitting: Gastroenterology

## 2022-07-28 ENCOUNTER — Ambulatory Visit: Payer: Medicare Other | Admitting: Gastroenterology

## 2022-07-28 VITALS — BP 113/70 | HR 77 | Temp 97.1°F | Resp 14 | Ht 64.25 in | Wt 182.0 lb

## 2022-07-28 DIAGNOSIS — K6289 Other specified diseases of anus and rectum: Secondary | ICD-10-CM | POA: Diagnosis not present

## 2022-07-28 DIAGNOSIS — K621 Rectal polyp: Secondary | ICD-10-CM | POA: Diagnosis not present

## 2022-07-28 DIAGNOSIS — Z1211 Encounter for screening for malignant neoplasm of colon: Secondary | ICD-10-CM

## 2022-07-28 DIAGNOSIS — D120 Benign neoplasm of cecum: Secondary | ICD-10-CM

## 2022-07-28 DIAGNOSIS — D123 Benign neoplasm of transverse colon: Secondary | ICD-10-CM | POA: Diagnosis not present

## 2022-07-28 DIAGNOSIS — D128 Benign neoplasm of rectum: Secondary | ICD-10-CM | POA: Diagnosis not present

## 2022-07-28 DIAGNOSIS — D122 Benign neoplasm of ascending colon: Secondary | ICD-10-CM

## 2022-07-28 DIAGNOSIS — I10 Essential (primary) hypertension: Secondary | ICD-10-CM | POA: Diagnosis not present

## 2022-07-28 DIAGNOSIS — E7800 Pure hypercholesterolemia, unspecified: Secondary | ICD-10-CM | POA: Diagnosis not present

## 2022-07-28 MED ORDER — SODIUM CHLORIDE 0.9 % IV SOLN: 500.0000 mL | Freq: Once | INTRAVENOUS | Status: DC

## 2022-07-28 NOTE — Patient Instructions (Signed)
Handout on polyps, diverticulosis and hemorrhoids given. No ibuprofen, naproxen, or other non-steroidal anti-inflammatory drugs for two weeks.  Resume previous diet and continue current medications.  Clip card given.     YOU HAD AN ENDOSCOPIC PROCEDURE TODAY AT [facility]:   Refer to the procedure report that was given to you for any specific questions about what was found during the examination.  If the procedure report does not answer your questions, please call your gastroenterologist to clarify.  If you requested that your care partner not be given the details of your procedure findings, then the procedure report has been included in a sealed envelope for you to review at your convenience later.  YOU SHOULD EXPECT: Some feelings of bloating in the abdomen. Passage of more gas than usual.  Walking can help get rid of the air that was put into your GI tract during the procedure and reduce the bloating. If you had a lower endoscopy (such as a colonoscopy or flexible sigmoidoscopy) you may notice spotting of blood in your stool or on the toilet paper. If you underwent a bowel prep for your procedure, you may not have a normal bowel movement for a few days.  Please Note:  You might notice some irritation and congestion in your nose or some drainage.  This is from the oxygen used during your procedure.  There is no need for concern and it should clear up in a day or so.  SYMPTOMS TO REPORT IMMEDIATELY:  Following lower endoscopy (colonoscopy or flexible sigmoidoscopy):  Excessive amounts of blood in the stool  Significant tenderness or worsening of abdominal pains  Swelling of the abdomen that is new, acute  Fever of 100F or higher   For urgent or emergent issues, a gastroenterologist can be reached at any hour by calling [phone]. Do not use MyChart messaging for urgent concerns.    DIET:  We do recommend a small meal at first, but then you may proceed to your regular  diet.  Drink plenty of fluids but you should avoid alcoholic beverages for 24 hours.  ACTIVITY:  You should plan to take it easy for the rest of today and you should NOT DRIVE or use heavy machinery until tomorrow (because of the sedation medicines used during the test).    FOLLOW UP: Our staff will call the number listed on your records the next business day following your procedure.  We will call around 7:15- 8:00 am to check on you and address any questions or concerns that you may have regarding the information given to you following your procedure. If we do not reach you, we will leave a message.     If any biopsies were taken you will be contacted by phone or by letter within the next 1-3 weeks.  Please call us at [phone] if you have not heard about the biopsies in 3 weeks.    SIGNATURES/CONFIDENTIALITY: You and/or your care partner have signed paperwork which will be entered into your electronic medical record.  These signatures attest to the fact that that the information above on your After Visit Summary has been reviewed and is understood.  Full responsibility of the confidentiality of this discharge information lies with you and/or your care-partner.

## 2022-07-28 NOTE — Progress Notes (Signed)
[facility] History and Physical   Primary Care Physician:  Marda Stalker, PA-C   Reason for Procedure:   Colon cancer screening  Plan:    colonoscopy     HPI: Janet Shelton is a 72 y.o. female  here for colonoscopy screening - last exam with Dr. Collene Mares - done 05/2012 - no polyps.   Patient denies any bowel symptoms at this time. No family history of colon cancer known. Otherwise feels well without any cardiopulmonary symptoms.   I have discussed risks / benefits of anesthesia and endoscopic procedure with Cathrine Muster and they wish to proceed with the exams as outlined today.    Past Medical History:  Diagnosis Date   Glaucoma 06/24/2019   Hypercholesterolemia    Hypertension    Osteopenia 08/2017   T score -1.1 FRAX 3.2% / 0.2%   Vitamin D deficiency     Past Surgical History:  Procedure Laterality Date   ABDOMINAL HYSTERECTOMY     TAH/BSO   CATARACT EXTRACTION, BILATERAL  2022   DILATION AND CURETTAGE OF UTERUS     KNEE ARTHROSCOPY     right... dr. Cay Schillings   NECK SURGERY     DISC   SHOULDER SURGERY  09/15/2013   METAL PIN     Prior to Admission medications   Medication Sig Start Date End Date Taking? Authorizing Provider  atorvastatin (LIPITOR) 20 MG tablet Take 20 mg by mouth daily.    [provider]  azaTHIOprine (IMURAN) 50 MG tablet Take 50 mg by mouth daily. 09/16/21   [provider]  brimonidine (ALPHAGAN) 0.2 % ophthalmic solution 1 drop 3 (three) times daily. 10/29/21   [provider]  Dorzolamide HCl-Timolol Mal PF 2-0.5 % SOLN Apply 1 drop to eye 2 (two) times daily. 11/26/21   [provider]  losartan-hydrochlorothiazide (HYZAAR) 100-12.5 MG tablet Take by mouth daily. Takes 1/2 10/30/21   [provider]  valACYclovir (VALTREX) 500 MG tablet TAKE 1 TABLET(500 MG) BY MOUTH TWICE DAILY FOR 5 DAYS 11/21/21   Princess Bruins, MD  Vitamin D, Cholecalciferol, 25 MCG (1000 UT) TABS Take 1  capsule by mouth daily.    [provider]    Current Outpatient Medications  Medication Sig Dispense Refill   atorvastatin (LIPITOR) 20 MG tablet Take 20 mg by mouth daily.     azaTHIOprine (IMURAN) 50 MG tablet Take 50 mg by mouth daily.     brimonidine (ALPHAGAN) 0.2 % ophthalmic solution 1 drop 3 (three) times daily.     Dorzolamide HCl-Timolol Mal PF 2-0.5 % SOLN Apply 1 drop to eye 2 (two) times daily.     losartan-hydrochlorothiazide (HYZAAR) 100-12.5 MG tablet Take by mouth daily. Takes 1/2     valACYclovir (VALTREX) 500 MG tablet TAKE 1 TABLET(500 MG) BY MOUTH TWICE DAILY FOR 5 DAYS 10 tablet 0   Vitamin D, Cholecalciferol, 25 MCG (1000 UT) TABS Take 1 capsule by mouth daily.     Current Facility-Administered Medications  Medication Dose Route Frequency Provider Last Rate Last Admin   0.9 %  sodium chloride infusion  500 mL Intravenous Once Yandiel Bergum, Carlota Raspberry, MD        Allergies as of 07/28/2022 - Review Complete 07/28/2022  Allergen Reaction Noted   Anaprox [naproxen sodium]  08/21/2011    Family History  Problem Relation Age of Onset   Hypertension Mother    Heart disease Mother    Hypertension Sister    Heart disease Sister  Colon cancer Neg Hx    Colon polyps Neg Hx    Esophageal cancer Neg Hx    Stomach cancer Neg Hx    Rectal cancer Neg Hx     Social History   Socioeconomic History   Marital status: Married    Spouse name: Not on file   Number of children: Not on file   Years of education: Not on file   Highest education level: Not on file  Occupational History   Not on file  Tobacco Use   Smoking status: Never   Smokeless tobacco: Never  Vaping Use   Vaping Use: Never used  Substance and Sexual Activity   Alcohol use: Not Currently    Comment: occ--1 glass of wine every 3 months   Drug use: No   Sexual activity: Not Currently    Comment: 1st intercourse- 23, partners- 54, hysterectomy  Other Topics Concern   Not on file  Social  History Narrative   Not on file   Social Determinants of Health   Financial Resource Strain: Not on file  Food Insecurity: Not on file  Transportation Needs: Not on file  Physical Activity: Not on file  Stress: Not on file  Social Connections: Not on file  Intimate Partner Violence: Not on file    Review of Systems: All other review of systems negative except as mentioned in the HPI.  Physical Exam: Vital signs BP (!) 151/102   Pulse 98   Temp (!) 97.1 F (36.2 C)   Ht 5' 4.25" (1.632 m)   Wt 182 lb (82.6 kg)   SpO2 98%   BMI 31.00 kg/m   General:   Alert,  Well-developed, pleasant and cooperative in NAD Lungs:  Clear throughout to auscultation.   Heart:  Regular rate and rhythm Abdomen:  Soft, nontender and nondistended.   Neuro/Psych:  Alert and cooperative. Normal mood and affect. A and O x 3  Jolly Mango, MD [facility]

## 2022-07-28 NOTE — Progress Notes (Signed)
Pt resting comfortably. VSS. Airway intact. SBAR complete to RN. All questions answered.   

## 2022-07-28 NOTE — Progress Notes (Signed)
Pt's states no medical or surgical changes since previsit or office visit. 

## 2022-07-28 NOTE — Progress Notes (Signed)
Called to room to assist during endoscopic procedure.  Patient ID and intended procedure confirmed with present staff. Received instructions for my participation in the procedure from the performing physician.  

## 2022-07-28 NOTE — Op Note (Signed)
[facility] Patient Name: Janet Shelton Procedure Date: 07/28/2022 9:20 AM MRN: [id number] Endoscopist: Remo Lipps P. Havery Moros , MD, [id number] Age: 72 Referring MD:  Date of Birth: Aug 27, 1950 Gender: Female Account #: [id number] Procedure:                Colonoscopy Indications:              Screening for colorectal malignant neoplasm Medicines:                Monitored Anesthesia Care Procedure:                Pre-Anesthesia Assessment:                           - Prior to the procedure, a History and Physical                            was performed, and patient medications and                            allergies were reviewed. The patient's tolerance of                            previous anesthesia was also reviewed. The risks                            and benefits of the procedure and the sedation                            options and risks were discussed with the patient.                            All questions were answered, and informed consent                            was obtained. Prior Anticoagulants: The patient has                            taken no anticoagulant or antiplatelet agents. ASA                            Grade Assessment: II - A patient with mild systemic                            disease. After reviewing the risks and benefits,                            the patient was deemed in satisfactory condition to                            undergo the procedure.                           After obtaining informed consent, the colonoscope  was passed under direct vision. Throughout the                            procedure, the patient's blood pressure, pulse, and                            oxygen saturations were monitored continuously. The                            Olympus PCF-H190DL (#[id number]) Colonoscope was                            introduced through the anus and advanced to the the                            terminal  ileum, with identification of the                            appendiceal orifice and IC valve. The colonoscopy                            was performed without difficulty. The patient                            tolerated the procedure well. The quality of the                            bowel preparation was good. The terminal ileum,                            ileocecal valve, appendiceal orifice, and rectum                            were photographed. Scope In: 9:41:46 AM Scope Out: 10:24:46 AM Scope Withdrawal Time: 0 hours 36 minutes 28 seconds  Total Procedure Duration: 0 hours 43 minutes 0 seconds  Findings:                 The perianal and digital rectal examinations were                            normal.                           The terminal ileum appeared normal.                           Two sessile polyps were found in the cecum. The                            polyps were 3 to 6 mm in size. These polyps were                            removed with a cold snare. Resection and retrieval  were complete. At the larger polyp site (I thought                            photo was taken but it was not), there was                            persistent slow oozing despite monitoring for a few                            minutes. I applied snare tip coagulation to the                            site however unfortunately oozing persisted. For                            hemostasis, one hemostatic clip was successfully                            placed with good hemostasis.                           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 Biopsies were taken with a cold                            forceps for histology which revealed fatty tissue                            to confirm the diagnosis, it was not removed.                           A 3 mm polyp was found in the ascending colon. The                             polyp was sessile. The polyp was removed with a                            cold snare. Resection and retrieval were complete.                           Five sessile polyps were found in the transverse                            colon. The polyps were 3 to 5 mm in size. These                            polyps were removed with a cold snare. Resection                            and retrieval were complete.  Two pedunculated polyps were found in the                            transverse colon. The polyps were 12 mm in size.                            These polyps were removed with a hot snare, could                            not fit through the scope channel, and removed                            individually with scope withdrawal. Resection and                            retrieval were complete.                           A few small-mouthed diverticula were found in the                            sigmoid colon.                           A 3 mm polyp was found in the rectum. The polyp was                            sessile. The polyp was removed with a cold snare.                            Resection and retrieval were complete.                           Internal hemorrhoids were found during retroflexion.                           Anal papilla(e) were hypertrophied.                           The exam was otherwise without abnormality. Complications:            No immediate complications. Estimated blood loss:                            Minimal. Estimated Blood Loss:     Estimated blood loss was minimal. Impression:               - The examined portion of the ileum was normal.                           - Two 3 to 6 mm polyps in the cecum, removed with a                            cold snare. Resected and retrieved. One site  treated as outlined above to treat persistent                            oozing..                           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                           - One 3 mm polyp in the ascending colon, removed                            with a cold snare. Resected and retrieved.                           - Five 3 to 5 mm polyps in the transverse colon,                            removed with a cold snare. Resected and retrieved.                           - Two 12 mm polyps in the transverse colon, removed                            with a hot snare. Resected and retrieved.                           - Diverticulosis in the sigmoid colon.                           - One 3 mm polyp in the rectum, removed with a cold                            snare. Resected and retrieved.                           - Internal hemorrhoids.                           - Anal papilla(e) were hypertrophied.                           - The examination was otherwise normal. Recommendation:           - Patient has a contact number available for                            emergencies. The signs and symptoms of potential                            delayed complications were discussed with the                            patient. Return to normal activities tomorrow.  Written discharge instructions were provided to the                            patient.                           - Resume previous diet.                           - Continue present medications.                           - Await pathology results.                           - No ibuprofen, naproxen, or other non-steroidal                            anti-inflammatory drugs for 2 weeks after polyp                            removal. Remo Lipps P. Havery Moros, MD 07/28/2022 10:39:34 AM This report has been signed electronically.

## 2022-07-29 ENCOUNTER — Telehealth: Payer: Self-pay

## 2022-07-29 NOTE — Telephone Encounter (Signed)
  Follow up Call-     07/28/2022    9:11 AM  Call back number  Post procedure Call Back phone  # [phone]  Permission to leave phone message Yes     Patient questions:  Do you have a fever, pain , or abdominal swelling? No. Pain Score  0 *  Have you tolerated food without any problems? Yes.    Have you been able to return to your normal activities? Yes.    Do you have any questions about your discharge instructions: Diet   No. Medications  No. Follow up visit  No.  Do you have questions or concerns about your Care? No.  Actions: * If pain score is 4 or above: No action needed, pain <4.

## 2022-07-31 DIAGNOSIS — M7502 Adhesive capsulitis of left shoulder: Secondary | ICD-10-CM | POA: Diagnosis not present

## 2022-07-31 DIAGNOSIS — M25612 Stiffness of left shoulder, not elsewhere classified: Secondary | ICD-10-CM | POA: Diagnosis not present

## 2022-08-01 DIAGNOSIS — M7502 Adhesive capsulitis of left shoulder: Secondary | ICD-10-CM | POA: Diagnosis not present

## 2022-08-01 DIAGNOSIS — M25612 Stiffness of left shoulder, not elsewhere classified: Secondary | ICD-10-CM | POA: Diagnosis not present

## 2022-08-04 DIAGNOSIS — M25612 Stiffness of left shoulder, not elsewhere classified: Secondary | ICD-10-CM | POA: Diagnosis not present

## 2022-08-04 DIAGNOSIS — M7502 Adhesive capsulitis of left shoulder: Secondary | ICD-10-CM | POA: Diagnosis not present

## 2022-08-08 DIAGNOSIS — M25612 Stiffness of left shoulder, not elsewhere classified: Secondary | ICD-10-CM | POA: Diagnosis not present

## 2022-08-08 DIAGNOSIS — M7502 Adhesive capsulitis of left shoulder: Secondary | ICD-10-CM | POA: Diagnosis not present

## 2022-08-15 DIAGNOSIS — M7502 Adhesive capsulitis of left shoulder: Secondary | ICD-10-CM | POA: Diagnosis not present

## 2022-08-15 DIAGNOSIS — M25612 Stiffness of left shoulder, not elsewhere classified: Secondary | ICD-10-CM | POA: Diagnosis not present

## 2022-08-18 DIAGNOSIS — M7502 Adhesive capsulitis of left shoulder: Secondary | ICD-10-CM | POA: Diagnosis not present

## 2022-08-22 DIAGNOSIS — M25612 Stiffness of left shoulder, not elsewhere classified: Secondary | ICD-10-CM | POA: Diagnosis not present

## 2022-08-22 DIAGNOSIS — M7502 Adhesive capsulitis of left shoulder: Secondary | ICD-10-CM | POA: Diagnosis not present

## 2022-08-25 DIAGNOSIS — M7502 Adhesive capsulitis of left shoulder: Secondary | ICD-10-CM | POA: Diagnosis not present

## 2022-08-25 DIAGNOSIS — M25612 Stiffness of left shoulder, not elsewhere classified: Secondary | ICD-10-CM | POA: Diagnosis not present

## 2022-08-29 DIAGNOSIS — H401131 Primary open-angle glaucoma, bilateral, mild stage: Secondary | ICD-10-CM | POA: Diagnosis not present

## 2022-09-04 DIAGNOSIS — M25612 Stiffness of left shoulder, not elsewhere classified: Secondary | ICD-10-CM | POA: Diagnosis not present

## 2022-09-04 DIAGNOSIS — M7502 Adhesive capsulitis of left shoulder: Secondary | ICD-10-CM | POA: Diagnosis not present

## 2022-09-22 ENCOUNTER — Other Ambulatory Visit: Payer: Self-pay

## 2022-09-22 DIAGNOSIS — B009 Herpesviral infection, unspecified: Secondary | ICD-10-CM

## 2022-09-22 MED ORDER — VALACYCLOVIR HCL 500 MG PO TABS: 500.0000 mg | ORAL_TABLET | Freq: Two times a day (BID) | ORAL | 0 refills | Status: AC

## 2022-09-22 NOTE — Telephone Encounter (Signed)
Pt calling to report the need for valtrex refill. States she hasn't needed it in a long time but flare up/break out started yesterday and is uncomfortable. Rx pend.

## 2022-09-29 DIAGNOSIS — M7502 Adhesive capsulitis of left shoulder: Secondary | ICD-10-CM | POA: Diagnosis not present

## 2022-10-02 DIAGNOSIS — M545 Low back pain, unspecified: Secondary | ICD-10-CM | POA: Diagnosis not present

## 2022-10-10 DIAGNOSIS — M7502 Adhesive capsulitis of left shoulder: Secondary | ICD-10-CM | POA: Diagnosis not present

## 2022-10-10 DIAGNOSIS — Z961 Presence of intraocular lens: Secondary | ICD-10-CM | POA: Diagnosis not present

## 2022-10-10 DIAGNOSIS — H18603 Keratoconus, unspecified, bilateral: Secondary | ICD-10-CM | POA: Diagnosis not present

## 2022-10-10 DIAGNOSIS — H209 Unspecified iridocyclitis: Secondary | ICD-10-CM | POA: Diagnosis not present

## 2022-10-10 DIAGNOSIS — H35373 Puckering of macula, bilateral: Secondary | ICD-10-CM | POA: Diagnosis not present

## 2022-10-10 DIAGNOSIS — M25612 Stiffness of left shoulder, not elsewhere classified: Secondary | ICD-10-CM | POA: Diagnosis not present

## 2022-10-10 DIAGNOSIS — Z79899 Other long term (current) drug therapy: Secondary | ICD-10-CM | POA: Diagnosis not present

## 2022-10-14 DIAGNOSIS — M25612 Stiffness of left shoulder, not elsewhere classified: Secondary | ICD-10-CM | POA: Diagnosis not present

## 2022-10-14 DIAGNOSIS — M7502 Adhesive capsulitis of left shoulder: Secondary | ICD-10-CM | POA: Diagnosis not present

## 2022-10-16 DIAGNOSIS — M25612 Stiffness of left shoulder, not elsewhere classified: Secondary | ICD-10-CM | POA: Diagnosis not present

## 2022-10-16 DIAGNOSIS — M7502 Adhesive capsulitis of left shoulder: Secondary | ICD-10-CM | POA: Diagnosis not present

## 2022-10-19 ENCOUNTER — Other Ambulatory Visit: Payer: Self-pay

## 2022-10-19 ENCOUNTER — Emergency Department (HOSPITAL_BASED_OUTPATIENT_CLINIC_OR_DEPARTMENT_OTHER)
Admission: EM | Admit: 2022-10-19 | Discharge: 2022-10-19 | Disposition: A | Discharge status: Home / Self Care | Payer: Medicare Other | Attending: Emergency Medicine | Admitting: Emergency Medicine

## 2022-10-19 ENCOUNTER — Encounter (HOSPITAL_BASED_OUTPATIENT_CLINIC_OR_DEPARTMENT_OTHER): Payer: Self-pay | Admitting: Emergency Medicine

## 2022-10-19 ENCOUNTER — Emergency Department (HOSPITAL_BASED_OUTPATIENT_CLINIC_OR_DEPARTMENT_OTHER): Payer: Medicare Other

## 2022-10-19 DIAGNOSIS — S0181XA Laceration without foreign body of other part of head, initial encounter: Secondary | ICD-10-CM | POA: Diagnosis not present

## 2022-10-19 DIAGNOSIS — W01198A Fall on same level from slipping, tripping and stumbling with subsequent striking against other object, initial encounter: Secondary | ICD-10-CM | POA: Insufficient documentation

## 2022-10-19 DIAGNOSIS — Y93K1 Activity, walking an animal: Secondary | ICD-10-CM | POA: Insufficient documentation

## 2022-10-19 DIAGNOSIS — Z79899 Other long term (current) drug therapy: Secondary | ICD-10-CM | POA: Diagnosis not present

## 2022-10-19 DIAGNOSIS — I10 Essential (primary) hypertension: Secondary | ICD-10-CM | POA: Insufficient documentation

## 2022-10-19 DIAGNOSIS — S199XXA Unspecified injury of neck, initial encounter: Secondary | ICD-10-CM | POA: Diagnosis not present

## 2022-10-19 DIAGNOSIS — W19XXXA Unspecified fall, initial encounter: Secondary | ICD-10-CM

## 2022-10-19 DIAGNOSIS — S0990XA Unspecified injury of head, initial encounter: Secondary | ICD-10-CM | POA: Diagnosis not present

## 2022-10-19 NOTE — ED Provider Notes (Signed)
[place] [facility] AT [facility] Provider Note   CSN: [id number] Arrival date & time: 10/19/22  4098     History  Chief Complaint  Patient presents with   Janet Shelton is a 72 y.o. female with medical history of glaucoma, hypertension, vitamin D deficiency.  Patient presents to ED for evaluation of fall.  Patient reports that prior to arrival, she was walking her dog when her dog walked in front of her causing her to trip over the dog.  Patient reports that she landed striking the right side of her forehead on her fireplace mantle.  Patient states that she developed a cut from this, there was a lot of blood and she came to the ED for evaluation.  Patient denies loss of consciousness, she denies taking blood thinners.  Patient is reporting pain over her right eye however she denies any headache, neck pain, back pain, loss of consciousness, lightheadedness, dizziness, weakness.  Patient denies any chest pain or shortness of breath prior to fall.  Patient denies any pain while moving her eyes.   Fall       Home Medications Prior to Admission medications   Medication Sig Start Date End Date Taking? Authorizing Provider  atorvastatin (LIPITOR) 20 MG tablet Take 20 mg by mouth daily.    [provider]  azaTHIOprine (IMURAN) 50 MG tablet Take 50 mg by mouth daily. 09/16/21   [provider]  Dorzolamide HCl-Timolol Mal PF 2-0.5 % SOLN Apply 1 drop to eye 2 (two) times daily. 11/26/21   [provider]  losartan-hydrochlorothiazide (HYZAAR) 100-12.5 MG tablet Take by mouth daily. Takes 1/2 10/30/21   [provider]  Vitamin D, Cholecalciferol, 25 MCG (1000 UT) TABS Take 1 capsule by mouth daily.    [provider]      Allergies    Anaprox [naproxen sodium]    Review of Systems   Review of Systems  Gastrointestinal:  Negative for nausea and vomiting.  Musculoskeletal:  Negative for back pain and neck pain.   Neurological:  Negative for dizziness, syncope, weakness and light-headedness.  All other systems reviewed and are negative.   Physical Exam Updated Vital Signs BP 120/88   Pulse 83   Temp 98 F (36.7 C)   Resp 17   Ht 5\' 4"  (1.626 m)   Wt 81.6 kg   SpO2 97%   BMI 30.90 kg/m  Physical Exam Vitals and nursing note reviewed.  Constitutional:      General: She is not in acute distress.    Appearance: Normal appearance. She is not ill-appearing, toxic-appearing or diaphoretic.  HENT:     Head: Normocephalic and atraumatic.      Comments: Superficial cut    Nose: Nose normal.     Mouth/Throat:     Mouth: Mucous membranes are moist.     Pharynx: Oropharynx is clear.  Eyes:     Extraocular Movements: Extraocular movements intact.     Conjunctiva/sclera: Conjunctivae normal.     Pupils: Pupils are equal, round, and reactive to light.     Comments: Nonpainful EOMs  Cardiovascular:     Rate and Rhythm: Normal rate and regular rhythm.  Pulmonary:     Effort: Pulmonary effort is normal.     Breath sounds: Normal breath sounds. No wheezing.  Abdominal:     General: Abdomen is flat.     Tenderness: There is no abdominal tenderness.  Musculoskeletal:  Cervical back: Normal range of motion and neck supple. No tenderness.     Comments: No midline C-spine tenderness  Skin:    General: Skin is warm and dry.     Capillary Refill: Capillary refill takes less than 2 seconds.  Neurological:     General: No focal deficit present.     Mental Status: She is alert and oriented to person, place, and time.     GCS: GCS eye subscore is 4. GCS verbal subscore is 5. GCS motor subscore is 6.     Cranial Nerves: Cranial nerves 2-12 are intact. No cranial nerve deficit.     Sensory: Sensation is intact. No sensory deficit.     Motor: Motor function is intact. No weakness.     ED Results / Procedures / Treatments   Labs (all labs ordered are listed, but only abnormal results are  displayed) Labs Reviewed - No data to display  EKG None  Radiology CT Head Wo Contrast  Result Date: 10/19/2022 CLINICAL DATA:  Neck trauma.  Fall. EXAM: CT HEAD WITHOUT CONTRAST CT CERVICAL SPINE WITHOUT CONTRAST TECHNIQUE: Multidetector CT imaging of the head and cervical spine was performed following the standard protocol without intravenous contrast. Multiplanar CT image reconstructions of the cervical spine were also generated. RADIATION DOSE REDUCTION: This exam was performed according to the departmental dose-optimization program which includes automated exposure control, adjustment of the mA and/or kV according to patient size and/or use of iterative reconstruction technique. COMPARISON:  None Available. FINDINGS: CT HEAD FINDINGS Brain: No evidence of acute infarction, hemorrhage, hydrocephalus, extra-axial collection or mass lesion/mass effect. Vascular: No hyperdense vessel or unexpected calcification. Skull: Soft tissue swelling lateral to the orbit. No acute fracture. Sinuses/Orbits: No acute finding. CT CERVICAL SPINE FINDINGS Alignment: Normal. Skull base and vertebrae: No acute fracture. No primary bone lesion or focal pathologic process. C6-7 ACDF with solid arthrodesis Soft tissues and spinal canal: No prevertebral fluid or swelling. No visible canal hematoma. Is Disc levels:  Generalized degenerative disc narrowing. Upper chest: No visible injury IMPRESSION: No evidence of intracranial or cervical spine injury. Electronically Signed   By: Tiburcio Pea M.D.   On: 10/19/2022 09:28   CT Cervical Spine Wo Contrast  Result Date: 10/19/2022 CLINICAL DATA:  Neck trauma.  Fall. EXAM: CT HEAD WITHOUT CONTRAST CT CERVICAL SPINE WITHOUT CONTRAST TECHNIQUE: Multidetector CT imaging of the head and cervical spine was performed following the standard protocol without intravenous contrast. Multiplanar CT image reconstructions of the cervical spine were also generated. RADIATION DOSE REDUCTION:  This exam was performed according to the departmental dose-optimization program which includes automated exposure control, adjustment of the mA and/or kV according to patient size and/or use of iterative reconstruction technique. COMPARISON:  None Available. FINDINGS: CT HEAD FINDINGS Brain: No evidence of acute infarction, hemorrhage, hydrocephalus, extra-axial collection or mass lesion/mass effect. Vascular: No hyperdense vessel or unexpected calcification. Skull: Soft tissue swelling lateral to the orbit. No acute fracture. Sinuses/Orbits: No acute finding. CT CERVICAL SPINE FINDINGS Alignment: Normal. Skull base and vertebrae: No acute fracture. No primary bone lesion or focal pathologic process. C6-7 ACDF with solid arthrodesis Soft tissues and spinal canal: No prevertebral fluid or swelling. No visible canal hematoma. Is Disc levels:  Generalized degenerative disc narrowing. Upper chest: No visible injury IMPRESSION: No evidence of intracranial or cervical spine injury. Electronically Signed   By: Tiburcio Pea M.D.   On: 10/19/2022 09:28    Procedures Procedures   Medications Ordered in ED Medications -  No data to display  ED Course/ Medical Decision Making/ A&P  Medical Decision Making Amount and/or Complexity of Data Reviewed Radiology: ordered.   72 year old female presents to the ED for evaluation.  Please see HPI for further details.  On examination patient does have 3 cm superficial laceration without skin separation over right eye.  Patient has no midline C-spine tenderness, full range of motion.  The patient has a normal neurological examination without any focal neurodeficits.  She is afebrile and nontachycardic.  Her lung sounds are clear bilaterally.  Her abdomen is soft and compressible.  She has no lumbar spinal tenderness.  EOMs are nonpainful.  CT head, CT cervical spine ordered in triage are unremarkable.  Patient had 3 cm superficial laceration repaired with Dermabond.   Patient EOMs nonpainful.  At this time patient workup unremarkable.  Patient advised she will need to follow-up with her PCP for reevaluation.  The patient was advised to return to the ED with any new or worsening signs or symptoms which were discussed in detail.  The patient voiced understanding of my instructions.  The patient all her questions answered to her satisfaction.  The patient is stable for discharge.   Final Clinical Impression(s) / ED Diagnoses Final diagnoses:  Fall, initial encounter    Rx / DC Orders ED Discharge Orders     None         Clent Ridges 10/19/22 1216    Ernie Avena, MD 10/19/22 2029

## 2022-10-19 NOTE — Discharge Instructions (Addendum)
Return to the ED with any new or worsening signs or symptoms such as painful movement of eyes, lethargy, dizziness, nausea or vomiting Please follow-up with your PCP for reevaluation in 5 days Please read the attached guide concerning nonsutured laceration care in regards to the Dermabond we have applied to superficial cut over your right eye You may take ibuprofen or Tylenol at home for pain

## 2022-10-19 NOTE — ED Triage Notes (Signed)
Patient reports trip and fall over dog hitting head on fireplace. Patient denies loss of consciousness or taking blood thinners.

## 2022-10-20 DIAGNOSIS — M7502 Adhesive capsulitis of left shoulder: Secondary | ICD-10-CM | POA: Diagnosis not present

## 2022-10-20 DIAGNOSIS — M25612 Stiffness of left shoulder, not elsewhere classified: Secondary | ICD-10-CM | POA: Diagnosis not present

## 2022-10-22 DIAGNOSIS — M25612 Stiffness of left shoulder, not elsewhere classified: Secondary | ICD-10-CM | POA: Diagnosis not present

## 2022-10-22 DIAGNOSIS — M7502 Adhesive capsulitis of left shoulder: Secondary | ICD-10-CM | POA: Diagnosis not present

## 2022-10-27 DIAGNOSIS — M25612 Stiffness of left shoulder, not elsewhere classified: Secondary | ICD-10-CM | POA: Diagnosis not present

## 2022-10-27 DIAGNOSIS — M7502 Adhesive capsulitis of left shoulder: Secondary | ICD-10-CM | POA: Diagnosis not present

## 2022-10-29 DIAGNOSIS — M7502 Adhesive capsulitis of left shoulder: Secondary | ICD-10-CM | POA: Diagnosis not present

## 2022-10-29 DIAGNOSIS — M25612 Stiffness of left shoulder, not elsewhere classified: Secondary | ICD-10-CM | POA: Diagnosis not present

## 2022-11-03 DIAGNOSIS — M25612 Stiffness of left shoulder, not elsewhere classified: Secondary | ICD-10-CM | POA: Diagnosis not present

## 2022-11-03 DIAGNOSIS — M7502 Adhesive capsulitis of left shoulder: Secondary | ICD-10-CM | POA: Diagnosis not present

## 2022-11-06 DIAGNOSIS — M25612 Stiffness of left shoulder, not elsewhere classified: Secondary | ICD-10-CM | POA: Diagnosis not present

## 2022-11-06 DIAGNOSIS — M7502 Adhesive capsulitis of left shoulder: Secondary | ICD-10-CM | POA: Diagnosis not present

## 2022-11-06 DIAGNOSIS — Z1389 Encounter for screening for other disorder: Secondary | ICD-10-CM | POA: Diagnosis not present

## 2022-11-06 DIAGNOSIS — Z6831 Body mass index (BMI) 31.0-31.9, adult: Secondary | ICD-10-CM | POA: Diagnosis not present

## 2022-11-06 DIAGNOSIS — Z0000 Encounter for general adult medical examination without abnormal findings: Secondary | ICD-10-CM | POA: Diagnosis not present

## 2022-11-11 DIAGNOSIS — M25612 Stiffness of left shoulder, not elsewhere classified: Secondary | ICD-10-CM | POA: Diagnosis not present

## 2022-11-11 DIAGNOSIS — M7502 Adhesive capsulitis of left shoulder: Secondary | ICD-10-CM | POA: Diagnosis not present

## 2022-11-12 DIAGNOSIS — E7800 Pure hypercholesterolemia, unspecified: Secondary | ICD-10-CM | POA: Diagnosis not present

## 2022-11-12 DIAGNOSIS — E559 Vitamin D deficiency, unspecified: Secondary | ICD-10-CM | POA: Diagnosis not present

## 2022-11-12 DIAGNOSIS — R739 Hyperglycemia, unspecified: Secondary | ICD-10-CM | POA: Diagnosis not present

## 2022-11-12 DIAGNOSIS — H209 Unspecified iridocyclitis: Secondary | ICD-10-CM | POA: Diagnosis not present

## 2022-11-12 DIAGNOSIS — I10 Essential (primary) hypertension: Secondary | ICD-10-CM | POA: Diagnosis not present

## 2022-11-12 DIAGNOSIS — Z6831 Body mass index (BMI) 31.0-31.9, adult: Secondary | ICD-10-CM | POA: Diagnosis not present

## 2022-11-19 DIAGNOSIS — R058 Other specified cough: Secondary | ICD-10-CM | POA: Diagnosis not present

## 2022-11-19 DIAGNOSIS — Z6831 Body mass index (BMI) 31.0-31.9, adult: Secondary | ICD-10-CM | POA: Diagnosis not present

## 2022-11-24 DIAGNOSIS — M7502 Adhesive capsulitis of left shoulder: Secondary | ICD-10-CM | POA: Diagnosis not present

## 2022-12-22 DIAGNOSIS — R229 Localized swelling, mass and lump, unspecified: Secondary | ICD-10-CM | POA: Diagnosis not present

## 2022-12-22 DIAGNOSIS — Z6831 Body mass index (BMI) 31.0-31.9, adult: Secondary | ICD-10-CM | POA: Diagnosis not present

## 2022-12-31 DIAGNOSIS — M7502 Adhesive capsulitis of left shoulder: Secondary | ICD-10-CM | POA: Diagnosis not present

## 2023-01-20 DIAGNOSIS — H401131 Primary open-angle glaucoma, bilateral, mild stage: Secondary | ICD-10-CM | POA: Diagnosis not present

## 2023-01-20 DIAGNOSIS — H04123 Dry eye syndrome of bilateral lacrimal glands: Secondary | ICD-10-CM | POA: Diagnosis not present

## 2023-01-20 DIAGNOSIS — H2013 Chronic iridocyclitis, bilateral: Secondary | ICD-10-CM | POA: Diagnosis not present

## 2023-03-04 DIAGNOSIS — M545 Low back pain, unspecified: Secondary | ICD-10-CM | POA: Diagnosis not present

## 2023-03-05 DIAGNOSIS — M7502 Adhesive capsulitis of left shoulder: Secondary | ICD-10-CM | POA: Diagnosis not present

## 2023-03-05 DIAGNOSIS — G8918 Other acute postprocedural pain: Secondary | ICD-10-CM | POA: Diagnosis not present

## 2023-03-05 DIAGNOSIS — M94212 Chondromalacia, left shoulder: Secondary | ICD-10-CM | POA: Diagnosis not present

## 2023-03-06 DIAGNOSIS — M25612 Stiffness of left shoulder, not elsewhere classified: Secondary | ICD-10-CM | POA: Diagnosis not present

## 2023-03-09 DIAGNOSIS — M25612 Stiffness of left shoulder, not elsewhere classified: Secondary | ICD-10-CM | POA: Diagnosis not present

## 2023-03-11 DIAGNOSIS — M25612 Stiffness of left shoulder, not elsewhere classified: Secondary | ICD-10-CM | POA: Diagnosis not present

## 2023-03-13 DIAGNOSIS — M25612 Stiffness of left shoulder, not elsewhere classified: Secondary | ICD-10-CM | POA: Diagnosis not present

## 2023-03-17 DIAGNOSIS — M25612 Stiffness of left shoulder, not elsewhere classified: Secondary | ICD-10-CM | POA: Diagnosis not present

## 2023-03-20 DIAGNOSIS — M25612 Stiffness of left shoulder, not elsewhere classified: Secondary | ICD-10-CM | POA: Diagnosis not present

## 2023-03-24 DIAGNOSIS — M25612 Stiffness of left shoulder, not elsewhere classified: Secondary | ICD-10-CM | POA: Diagnosis not present

## 2023-03-26 DIAGNOSIS — M25612 Stiffness of left shoulder, not elsewhere classified: Secondary | ICD-10-CM | POA: Diagnosis not present

## 2023-03-31 DIAGNOSIS — Z79899 Other long term (current) drug therapy: Secondary | ICD-10-CM | POA: Diagnosis not present

## 2023-03-31 DIAGNOSIS — Z23 Encounter for immunization: Secondary | ICD-10-CM | POA: Diagnosis not present

## 2023-03-31 DIAGNOSIS — J029 Acute pharyngitis, unspecified: Secondary | ICD-10-CM | POA: Diagnosis not present

## 2023-03-31 DIAGNOSIS — Z6830 Body mass index (BMI) 30.0-30.9, adult: Secondary | ICD-10-CM | POA: Diagnosis not present

## 2023-03-31 DIAGNOSIS — H35373 Puckering of macula, bilateral: Secondary | ICD-10-CM | POA: Diagnosis not present

## 2023-03-31 DIAGNOSIS — Z961 Presence of intraocular lens: Secondary | ICD-10-CM | POA: Diagnosis not present

## 2023-03-31 DIAGNOSIS — H209 Unspecified iridocyclitis: Secondary | ICD-10-CM | POA: Diagnosis not present

## 2023-03-31 DIAGNOSIS — H18603 Keratoconus, unspecified, bilateral: Secondary | ICD-10-CM | POA: Diagnosis not present

## 2023-04-01 DIAGNOSIS — Z79899 Other long term (current) drug therapy: Secondary | ICD-10-CM | POA: Diagnosis not present

## 2023-04-01 DIAGNOSIS — M25612 Stiffness of left shoulder, not elsewhere classified: Secondary | ICD-10-CM | POA: Diagnosis not present

## 2023-04-01 DIAGNOSIS — H209 Unspecified iridocyclitis: Secondary | ICD-10-CM | POA: Diagnosis not present

## 2023-04-06 DIAGNOSIS — M25612 Stiffness of left shoulder, not elsewhere classified: Secondary | ICD-10-CM | POA: Diagnosis not present

## 2023-04-14 DIAGNOSIS — M25612 Stiffness of left shoulder, not elsewhere classified: Secondary | ICD-10-CM | POA: Diagnosis not present

## 2023-04-16 DIAGNOSIS — M25612 Stiffness of left shoulder, not elsewhere classified: Secondary | ICD-10-CM | POA: Diagnosis not present

## 2023-04-21 DIAGNOSIS — Z1231 Encounter for screening mammogram for malignant neoplasm of breast: Secondary | ICD-10-CM | POA: Diagnosis not present

## 2023-04-23 ENCOUNTER — Encounter: Payer: Self-pay | Admitting: Obstetrics and Gynecology

## 2023-04-24 DIAGNOSIS — M25612 Stiffness of left shoulder, not elsewhere classified: Secondary | ICD-10-CM | POA: Diagnosis not present

## 2023-04-27 DIAGNOSIS — M25612 Stiffness of left shoulder, not elsewhere classified: Secondary | ICD-10-CM | POA: Diagnosis not present

## 2023-05-01 DIAGNOSIS — M25612 Stiffness of left shoulder, not elsewhere classified: Secondary | ICD-10-CM | POA: Diagnosis not present

## 2023-05-04 DIAGNOSIS — M25612 Stiffness of left shoulder, not elsewhere classified: Secondary | ICD-10-CM | POA: Diagnosis not present

## 2023-05-06 DIAGNOSIS — E559 Vitamin D deficiency, unspecified: Secondary | ICD-10-CM | POA: Diagnosis not present

## 2023-05-06 DIAGNOSIS — I10 Essential (primary) hypertension: Secondary | ICD-10-CM | POA: Diagnosis not present

## 2023-05-06 DIAGNOSIS — E7800 Pure hypercholesterolemia, unspecified: Secondary | ICD-10-CM | POA: Diagnosis not present

## 2023-05-06 DIAGNOSIS — R739 Hyperglycemia, unspecified: Secondary | ICD-10-CM | POA: Diagnosis not present

## 2023-05-11 DIAGNOSIS — M25612 Stiffness of left shoulder, not elsewhere classified: Secondary | ICD-10-CM | POA: Diagnosis not present

## 2023-05-18 DIAGNOSIS — M25612 Stiffness of left shoulder, not elsewhere classified: Secondary | ICD-10-CM | POA: Diagnosis not present

## 2023-05-29 DIAGNOSIS — H5201 Hypermetropia, right eye: Secondary | ICD-10-CM | POA: Diagnosis not present

## 2023-05-29 DIAGNOSIS — H52203 Unspecified astigmatism, bilateral: Secondary | ICD-10-CM | POA: Diagnosis not present

## 2023-05-29 DIAGNOSIS — Z961 Presence of intraocular lens: Secondary | ICD-10-CM | POA: Diagnosis not present

## 2023-05-29 DIAGNOSIS — H43813 Vitreous degeneration, bilateral: Secondary | ICD-10-CM | POA: Diagnosis not present

## 2023-05-29 DIAGNOSIS — H401131 Primary open-angle glaucoma, bilateral, mild stage: Secondary | ICD-10-CM | POA: Diagnosis not present

## 2023-06-29 DIAGNOSIS — M7502 Adhesive capsulitis of left shoulder: Secondary | ICD-10-CM | POA: Diagnosis not present

## 2023-07-16 ENCOUNTER — Telehealth: Payer: Self-pay | Admitting: *Deleted

## 2023-07-16 NOTE — Telephone Encounter (Signed)
Ok to order at [facility].

## 2023-07-16 NOTE — Telephone Encounter (Signed)
Call placed to patient, left detailed message advising BMD order to [facility], may call directly to schedule. Return call to office if any additional questions.   Order printed and to [facility] for signature and faxing.   Encounter closed.

## 2023-07-16 NOTE — Telephone Encounter (Signed)
Patient left message requesting BMD order to [facility].   Last AEX 12/04/21 -ML BMD 08/24/17 -osteopenia  Next AEX not scheduled.   Jami -please review and advise on next BMD

## 2023-07-16 NOTE — Telephone Encounter (Signed)
Order faxed successfully on 07/16/2023.

## 2023-08-04 DIAGNOSIS — Z961 Presence of intraocular lens: Secondary | ICD-10-CM | POA: Diagnosis not present

## 2023-08-04 DIAGNOSIS — H209 Unspecified iridocyclitis: Secondary | ICD-10-CM | POA: Diagnosis not present

## 2023-08-04 DIAGNOSIS — Z79899 Other long term (current) drug therapy: Secondary | ICD-10-CM | POA: Diagnosis not present

## 2023-08-04 DIAGNOSIS — H35373 Puckering of macula, bilateral: Secondary | ICD-10-CM | POA: Diagnosis not present

## 2023-08-04 DIAGNOSIS — H18603 Keratoconus, unspecified, bilateral: Secondary | ICD-10-CM | POA: Diagnosis not present

## 2023-08-10 DIAGNOSIS — N958 Other specified menopausal and perimenopausal disorders: Secondary | ICD-10-CM | POA: Diagnosis not present

## 2023-08-10 DIAGNOSIS — E2839 Other primary ovarian failure: Secondary | ICD-10-CM | POA: Diagnosis not present

## 2023-08-12 ENCOUNTER — Encounter: Payer: Self-pay | Admitting: Obstetrics and Gynecology

## 2023-09-19 ENCOUNTER — Telehealth: Payer: Self-pay | Admitting: Obstetrics and Gynecology

## 2023-09-19 MED ORDER — VALACYCLOVIR HCL 500 MG PO TABS
500.0000 mg | ORAL_TABLET | Freq: Two times a day (BID) | ORAL | 0 refills | Status: AC
Start: 2023-09-19 — End: 2023-09-22

## 2023-09-19 MED ORDER — VALACYCLOVIR HCL 500 MG PO TABS: 500.0000 mg | ORAL_TABLET | Freq: Two times a day (BID) | ORAL | 6 refills | Status: AC

## 2023-09-19 NOTE — Telephone Encounter (Signed)
 Patient with active HSV outbreak. Former patient of Dr Seymour Bars Needs Rx for Valacyclovir. Previously used 500 mg BID for 5 days.

## 2023-09-22 DIAGNOSIS — H401131 Primary open-angle glaucoma, bilateral, mild stage: Secondary | ICD-10-CM | POA: Diagnosis not present

## 2023-09-23 ENCOUNTER — Telehealth: Payer: Self-pay

## 2023-09-23 NOTE — Telephone Encounter (Signed)
 Pt scheduled w/ Dr. Kennith Center on 11/03/2023. Encounter closed.

## 2023-09-23 NOTE — Telephone Encounter (Signed)
 Pt notified and voiced understanding. Msg sent to appt desk to contact the pt to schedule B&P with pt's provider of choice.

## 2023-09-23 NOTE — Telephone Encounter (Signed)
 Former [facility] pt LVM in triage line stating that she had a flare up of her HSV on Sat and had to call the on-call provider for a refill of her valtex. Pt reports that the last outbreak she had was ~13yr ago. Was advised by the on-call doctor that she would send refills. However, per pt, there are no refills left. She requested to have refills on file in case it happens again in the near future.   Per EMR investigation: Dr. Estanislado Pandy sent the pt in #10 with six refills on 09/19/2023. However, end date for #10 doses is on 09/24/2023 and expiration date of rx isnt until 09/18/2024. I will contact the pt to notify her to call the pharmacy back for the refill on 09/25/2023.   However, need to know since the pt hasn't been seen since 2023. How often she needs to be seen to get refills in the future. So, that I can make her aware. No current recalls in her chart. Thanks.

## 2023-09-23 NOTE — Telephone Encounter (Signed)
 Needs to be seen yearly for medication management.

## 2023-10-21 DIAGNOSIS — E7800 Pure hypercholesterolemia, unspecified: Secondary | ICD-10-CM | POA: Diagnosis not present

## 2023-10-21 DIAGNOSIS — I10 Essential (primary) hypertension: Secondary | ICD-10-CM | POA: Diagnosis not present

## 2023-11-03 ENCOUNTER — Encounter: Payer: Self-pay | Admitting: Obstetrics and Gynecology

## 2023-11-03 ENCOUNTER — Ambulatory Visit (INDEPENDENT_AMBULATORY_CARE_PROVIDER_SITE_OTHER): Admitting: Obstetrics and Gynecology

## 2023-11-03 VITALS — BP 128/78 | HR 89 | Temp 98.1°F | Ht 65.25 in | Wt 183.8 lb

## 2023-11-03 DIAGNOSIS — Z1331 Encounter for screening for depression: Secondary | ICD-10-CM | POA: Diagnosis not present

## 2023-11-03 DIAGNOSIS — Z9189 Other specified personal risk factors, not elsewhere classified: Secondary | ICD-10-CM

## 2023-11-03 DIAGNOSIS — B009 Herpesviral infection, unspecified: Secondary | ICD-10-CM | POA: Diagnosis not present

## 2023-11-03 DIAGNOSIS — Z01419 Encounter for gynecological examination (general) (routine) without abnormal findings: Secondary | ICD-10-CM | POA: Insufficient documentation

## 2023-11-03 MED ORDER — VALACYCLOVIR HCL 500 MG PO TABS: 500.0000 mg | ORAL_TABLET | Freq: Two times a day (BID) | ORAL | 3 refills | Status: AC

## 2023-11-03 NOTE — Assessment & Plan Note (Signed)
 Cervical cancer screening performed according to ASCCP guidelines. Encouraged annual mammogram screening Colonoscopy UTD DXA UTD Labs and immunizations with her primary Encouraged safe sexual practices as indicated Encouraged healthy lifestyle practices with diet and exercise For patients over 72yo, I recommend 1200mg  calcium daily and 800IU of vitamin D daily.

## 2023-11-03 NOTE — Patient Instructions (Signed)

## 2023-11-03 NOTE — Progress Notes (Signed)
 73 y.o. G0P0000 postmenopausal female s/p TAH, BSO (AUB/endo) with HSV here for annual exam- high risk medicare. Married.  Pt requests refills on valtrex . No other complaints.  Postmenopausal bleeding: none Pelvic discharge or pain: none Breast mass, nipple discharge or skin changes : none Last PAP: No results found for: "DIAGPAP", "HPVHIGH", "ADEQPAP" Last mammogram: 04/21/23 BIRADS 1, density b Last DXA: 08/10/23 normal Last colonoscopy: 07/28/22 q55yr Sexually active: yes  Exercising: walking dog only, planning to get more active Smoker:no  Flowsheet Row Office Visit from 11/03/2023 in [facility]  PHQ-2 Total Score 0       GYN HISTORY: Hx of TAH, BSO (AUB/endo)  OB History  Gravida Para Term Preterm AB Living  0 0 0 0 0 0  SAB IAB Ectopic Multiple Live Births  0 0 0 0 0    Past Medical History:  Diagnosis Date   Glaucoma 06/24/2019   Hypercholesterolemia    Hypertension    Osteopenia 08/2017   T score -1.1 FRAX 3.2% / 0.2%   Vitamin D  deficiency     Past Surgical History:  Procedure Laterality Date   ABDOMINAL HYSTERECTOMY     TAH/BSO   CATARACT EXTRACTION, BILATERAL  2022   DILATION AND CURETTAGE OF UTERUS     KNEE ARTHROSCOPY     right... dr. Sander Crooked   NECK SURGERY     DISC   SHOULDER SURGERY  09/15/2013   METAL PIN     Current Outpatient Medications on File Prior to Visit  Medication Sig Dispense Refill   atorvastatin (LIPITOR) 20 MG tablet Take 20 mg by mouth daily.     azaTHIOprine (IMURAN) 50 MG tablet Take 50 mg by mouth daily.     Dorzolamide HCl-Timolol Mal PF 2-0.5 % SOLN Apply 1 drop to eye 2 (two) times daily.     losartan-hydrochlorothiazide (HYZAAR) 100-12.5 MG tablet Take by mouth daily. Takes 1/2     Vitamin D , Cholecalciferol, 25 MCG (1000 UT) TABS Take 1 capsule by mouth daily.     No current facility-administered medications on file prior to visit.    Social History   Socioeconomic History    Marital status: Married    Spouse name: Not on file   Number of children: Not on file   Years of education: Not on file   Highest education level: Not on file  Occupational History   Not on file  Tobacco Use   Smoking status: Never   Smokeless tobacco: Never  Vaping Use   Vaping status: Never Used  Substance and Sexual Activity   Alcohol use: Not Currently    Comment: occ--1 glass of wine every 3 months   Drug use: No   Sexual activity: Yes    Comment: 1st intercourse- 65, partners- 5, hysterectomy  Other Topics Concern   Not on file  Social History Narrative   Not on file   Social Drivers of Health   Financial Resource Strain: Not on file  Food Insecurity: Not on file  Transportation Needs: Not on file  Physical Activity: Not on file  Stress: Not on file  Social Connections: Not on file  Intimate Partner Violence: Not on file    Family History  Problem Relation Age of Onset   Hypertension Mother    Heart disease Mother    Hypertension Sister    Heart disease Sister    Colon cancer Neg Hx    Colon polyps Neg Hx    Esophageal  cancer Neg Hx    Stomach cancer Neg Hx    Rectal cancer Neg Hx     Allergies  Allergen Reactions   Anaprox [Naproxen Sodium]       PE Today's Vitals   11/03/23 0804  BP: 128/78  Pulse: 89  Temp: 98.1 F (36.7 C)  TempSrc: Oral  SpO2: 98%  Weight: 183 lb 12.8 oz (83.4 kg)  Height: 5' 5.25" (1.657 m)   Body mass index is 30.35 kg/m.  Physical Exam Vitals reviewed. Exam conducted with a chaperone present.  Constitutional:      General: She is not in acute distress.    Appearance: Normal appearance.  HENT:     Head: Normocephalic and atraumatic.     Nose: Nose normal.  Eyes:     Extraocular Movements: Extraocular movements intact.     Conjunctiva/sclera: Conjunctivae normal.  Neck:     Thyroid: No thyroid mass, thyromegaly or thyroid tenderness.  Pulmonary:     Effort: Pulmonary effort is normal.  Chest:     Chest  wall: No mass or tenderness.  Breasts:    Right: Normal. No swelling, mass, nipple discharge or tenderness.     Left: Normal. No swelling, mass, nipple discharge or tenderness.  Abdominal:     General: There is no distension.     Palpations: Abdomen is soft.     Tenderness: There is no abdominal tenderness.  Genitourinary:    General: Normal vulva.     Exam position: Lithotomy position.     Urethra: No prolapse.     Vagina: Normal. No vaginal discharge or bleeding.     Cervix: No lesion.     Adnexa: Right adnexa normal and left adnexa normal.     Comments: Cervix and uterus absent Musculoskeletal:        General: Normal range of motion.     Cervical back: Normal range of motion.  Lymphadenopathy:     Upper Body:     Right upper body: No axillary adenopathy.     Left upper body: No axillary adenopathy.     Lower Body: No right inguinal adenopathy. No left inguinal adenopathy.  Skin:    General: Skin is warm and dry.  Neurological:     General: No focal deficit present.     Mental Status: She is alert.  Psychiatric:        Mood and Affect: Mood normal.        Behavior: Behavior normal.      Assessment and Plan:        Well woman exam with routine gynecological exam Assessment & Plan: Cervical cancer screening performed according to ASCCP guidelines. Encouraged annual mammogram screening Colonoscopy UTD DXA UTD Labs and immunizations with her primary Encouraged safe sexual practices as indicated Encouraged healthy lifestyle practices with diet and exercise For patients over 72yo, I recommend 1200mg  calcium daily and 800IU of vitamin D  daily.    Other specified personal risk factors, not elsewhere classified  HSV infection -     valACYclovir  HCl; Take 1 tablet (500 mg total) by mouth 2 (two) times daily for 3 days. With outbreaks  Dispense: 30 tablet; Refill: 3    Romaine Closs, MD

## 2023-11-12 DIAGNOSIS — E559 Vitamin D deficiency, unspecified: Secondary | ICD-10-CM | POA: Diagnosis not present

## 2023-11-12 DIAGNOSIS — Z6831 Body mass index (BMI) 31.0-31.9, adult: Secondary | ICD-10-CM | POA: Diagnosis not present

## 2023-11-12 DIAGNOSIS — H209 Unspecified iridocyclitis: Secondary | ICD-10-CM | POA: Diagnosis not present

## 2023-11-12 DIAGNOSIS — Z0000 Encounter for general adult medical examination without abnormal findings: Secondary | ICD-10-CM | POA: Diagnosis not present

## 2023-11-12 DIAGNOSIS — E7800 Pure hypercholesterolemia, unspecified: Secondary | ICD-10-CM | POA: Diagnosis not present

## 2023-11-12 DIAGNOSIS — I10 Essential (primary) hypertension: Secondary | ICD-10-CM | POA: Diagnosis not present

## 2023-11-21 DIAGNOSIS — E7800 Pure hypercholesterolemia, unspecified: Secondary | ICD-10-CM | POA: Diagnosis not present

## 2023-11-21 DIAGNOSIS — I10 Essential (primary) hypertension: Secondary | ICD-10-CM | POA: Diagnosis not present

## 2023-12-08 DIAGNOSIS — H209 Unspecified iridocyclitis: Secondary | ICD-10-CM | POA: Diagnosis not present

## 2023-12-08 DIAGNOSIS — H18603 Keratoconus, unspecified, bilateral: Secondary | ICD-10-CM | POA: Diagnosis not present

## 2023-12-08 DIAGNOSIS — H35373 Puckering of macula, bilateral: Secondary | ICD-10-CM | POA: Diagnosis not present

## 2023-12-08 DIAGNOSIS — Z79899 Other long term (current) drug therapy: Secondary | ICD-10-CM | POA: Diagnosis not present

## 2023-12-08 DIAGNOSIS — Z961 Presence of intraocular lens: Secondary | ICD-10-CM | POA: Diagnosis not present

## 2023-12-21 DIAGNOSIS — E7800 Pure hypercholesterolemia, unspecified: Secondary | ICD-10-CM | POA: Diagnosis not present

## 2023-12-21 DIAGNOSIS — I10 Essential (primary) hypertension: Secondary | ICD-10-CM | POA: Diagnosis not present

## 2024-01-21 DIAGNOSIS — E7800 Pure hypercholesterolemia, unspecified: Secondary | ICD-10-CM | POA: Diagnosis not present

## 2024-01-21 DIAGNOSIS — I10 Essential (primary) hypertension: Secondary | ICD-10-CM | POA: Diagnosis not present

## 2024-01-27 DIAGNOSIS — H2013 Chronic iridocyclitis, bilateral: Secondary | ICD-10-CM | POA: Diagnosis not present

## 2024-01-27 DIAGNOSIS — H401131 Primary open-angle glaucoma, bilateral, mild stage: Secondary | ICD-10-CM | POA: Diagnosis not present

## 2024-02-18 DIAGNOSIS — E7800 Pure hypercholesterolemia, unspecified: Secondary | ICD-10-CM | POA: Diagnosis not present

## 2024-02-18 DIAGNOSIS — I10 Essential (primary) hypertension: Secondary | ICD-10-CM | POA: Diagnosis not present

## 2024-02-18 DIAGNOSIS — E559 Vitamin D deficiency, unspecified: Secondary | ICD-10-CM | POA: Diagnosis not present

## 2024-02-18 DIAGNOSIS — G479 Sleep disorder, unspecified: Secondary | ICD-10-CM | POA: Diagnosis not present

## 2024-02-21 DIAGNOSIS — I10 Essential (primary) hypertension: Secondary | ICD-10-CM | POA: Diagnosis not present

## 2024-02-21 DIAGNOSIS — E7800 Pure hypercholesterolemia, unspecified: Secondary | ICD-10-CM | POA: Diagnosis not present

## 2024-03-22 DIAGNOSIS — E7800 Pure hypercholesterolemia, unspecified: Secondary | ICD-10-CM | POA: Diagnosis not present

## 2024-03-22 DIAGNOSIS — I10 Essential (primary) hypertension: Secondary | ICD-10-CM | POA: Diagnosis not present

## 2024-04-08 DIAGNOSIS — Z23 Encounter for immunization: Secondary | ICD-10-CM | POA: Diagnosis not present

## 2024-04-12 DIAGNOSIS — H18603 Keratoconus, unspecified, bilateral: Secondary | ICD-10-CM | POA: Diagnosis not present

## 2024-04-12 DIAGNOSIS — Z961 Presence of intraocular lens: Secondary | ICD-10-CM | POA: Diagnosis not present

## 2024-04-12 DIAGNOSIS — H209 Unspecified iridocyclitis: Secondary | ICD-10-CM | POA: Diagnosis not present

## 2024-04-12 DIAGNOSIS — H35373 Puckering of macula, bilateral: Secondary | ICD-10-CM | POA: Diagnosis not present

## 2024-04-12 DIAGNOSIS — Z79899 Other long term (current) drug therapy: Secondary | ICD-10-CM | POA: Diagnosis not present

## 2024-04-13 DIAGNOSIS — Z79899 Other long term (current) drug therapy: Secondary | ICD-10-CM | POA: Diagnosis not present

## 2024-04-13 DIAGNOSIS — H209 Unspecified iridocyclitis: Secondary | ICD-10-CM | POA: Diagnosis not present

## 2024-04-22 DIAGNOSIS — E7800 Pure hypercholesterolemia, unspecified: Secondary | ICD-10-CM | POA: Diagnosis not present

## 2024-04-22 DIAGNOSIS — I10 Essential (primary) hypertension: Secondary | ICD-10-CM | POA: Diagnosis not present

## 2024-04-26 DIAGNOSIS — Z1231 Encounter for screening mammogram for malignant neoplasm of breast: Secondary | ICD-10-CM | POA: Diagnosis not present

## 2024-04-26 LAB — HM MAMMOGRAPHY

## 2024-04-27 ENCOUNTER — Encounter: Payer: Self-pay | Admitting: Obstetrics and Gynecology

## 2024-05-02 ENCOUNTER — Ambulatory Visit: Payer: Self-pay | Admitting: Obstetrics and Gynecology

## 2024-05-16 ENCOUNTER — Encounter: Payer: Self-pay | Admitting: Obstetrics and Gynecology

## 2024-05-16 DIAGNOSIS — M810 Age-related osteoporosis without current pathological fracture: Secondary | ICD-10-CM | POA: Diagnosis not present

## 2024-05-16 DIAGNOSIS — R739 Hyperglycemia, unspecified: Secondary | ICD-10-CM | POA: Diagnosis not present

## 2024-05-16 DIAGNOSIS — E559 Vitamin D deficiency, unspecified: Secondary | ICD-10-CM | POA: Diagnosis not present

## 2024-05-16 DIAGNOSIS — E7800 Pure hypercholesterolemia, unspecified: Secondary | ICD-10-CM | POA: Diagnosis not present

## 2024-05-16 DIAGNOSIS — Z6831 Body mass index (BMI) 31.0-31.9, adult: Secondary | ICD-10-CM | POA: Diagnosis not present

## 2024-05-16 DIAGNOSIS — I10 Essential (primary) hypertension: Secondary | ICD-10-CM | POA: Diagnosis not present

## 2024-05-22 DIAGNOSIS — I10 Essential (primary) hypertension: Secondary | ICD-10-CM | POA: Diagnosis not present

## 2024-05-22 DIAGNOSIS — E7800 Pure hypercholesterolemia, unspecified: Secondary | ICD-10-CM | POA: Diagnosis not present

## 2024-07-24 ENCOUNTER — Emergency Department (HOSPITAL_BASED_OUTPATIENT_CLINIC_OR_DEPARTMENT_OTHER)
Admission: EM | Admit: 2024-07-24 | Discharge: 2024-07-24 | Disposition: A | Discharge status: Home / Self Care | Attending: Emergency Medicine | Admitting: Emergency Medicine

## 2024-07-24 ENCOUNTER — Other Ambulatory Visit: Payer: Self-pay

## 2024-07-24 ENCOUNTER — Encounter (HOSPITAL_BASED_OUTPATIENT_CLINIC_OR_DEPARTMENT_OTHER): Payer: Self-pay

## 2024-07-24 DIAGNOSIS — R10A1 Flank pain, right side: Secondary | ICD-10-CM | POA: Insufficient documentation

## 2024-07-24 DIAGNOSIS — M549 Dorsalgia, unspecified: Secondary | ICD-10-CM | POA: Diagnosis not present

## 2024-07-24 DIAGNOSIS — Z79899 Other long term (current) drug therapy: Secondary | ICD-10-CM | POA: Diagnosis not present

## 2024-07-24 LAB — URINALYSIS, ROUTINE W REFLEX MICROSCOPIC
Bilirubin Urine: NEGATIVE
Glucose, UA: NEGATIVE mg/dL
Ketones, ur: NEGATIVE mg/dL
Nitrite: NEGATIVE
Protein, ur: NEGATIVE mg/dL
Specific Gravity, Urine: 1.02 (ref 1.005–1.030)
pH: 7 (ref 5.0–8.0)

## 2024-07-24 LAB — URINALYSIS, MICROSCOPIC (REFLEX)

## 2024-07-24 MED ORDER — METHYLPREDNISOLONE 4 MG PO TBPK: ORAL_TABLET | ORAL | 0 refills | Status: AC

## 2024-07-24 NOTE — Discharge Instructions (Addendum)
 Your back pain is most likely due to a muscular strain.  There is been a lot of research on back pain, unfortunately the only thing that seems to really help is Tylenol and ibuprofen.  Relative rest is also important to not lift greater than 10 pounds bending or twisting at the waist.  Please follow-up with your family physician.  The other thing that really seems to benefit patients is physical therapy which your doctor may send you for.  Please return to the emergency department for new numbness or weakness to your arms or legs. Difficulty with urinating or urinating or pooping on yourself.  Also if you cannot feel toilet paper when you wipe or get a fever.   Take the steroids as prescribed.  Use the gel as indicated on the box Also take tylenol 1000mg (2 extra strength) four times a day.

## 2024-07-24 NOTE — ED Provider Notes (Signed)
 " [place] [facility] Provider Note   CSN: [id number] Arrival date & time: 07/24/24  1609     Patient presents with: Flank Pain   Janet Shelton is a 74 y.o. female.   74 yo F with a cc of right sided back pain.  This started about an hour or 2 ago.  She tried some Tylenol and thinks maybe it helped a little bit.  Denies urinary symptoms denies nausea or vomiting.  She denies trauma.  Has a history of sciatica and that is been flared up on her little bit but she thinks this is unrelated.  She is worried that maybe it could be her kidney.  Tells me that years ago she had a kidney infection and had to be admitted into the hospital.   Flank Pain       Prior to Admission medications  Medication Sig Start Date End Date Taking? Authorizing Provider  methylPREDNISolone  (MEDROL  DOSEPAK) 4 MG TBPK tablet Day 1: 8mg  before breakfast, 4 mg after lunch, 4 mg after supper, and 8 mg at bedtime Day 2: 4 mg before breakfast, 4 mg after lunch, 4 mg  after supper, and 8 mg  at bedtime Day 3:  4 mg  before breakfast, 4 mg  after lunch, 4 mg after supper, and 4 mg  at bedtime Day 4: 4 mg  before breakfast, 4 mg  after lunch, and 4 mg at bedtime Day 5: 4 mg  before breakfast and 4 mg at bedtime Day 6: 4 mg  before breakfast 07/24/24  Yes Narya Beavin, DO  atorvastatin (LIPITOR) 20 MG tablet Take 20 mg by mouth daily.    [provider]  azaTHIOprine (IMURAN) 50 MG tablet Take 50 mg by mouth daily. 09/16/21   [provider]  Dorzolamide HCl-Timolol Mal PF 2-0.5 % SOLN Apply 1 drop to eye 2 (two) times daily. 11/26/21   [provider]  losartan-hydrochlorothiazide (HYZAAR) 100-12.5 MG tablet Take by mouth daily. Takes 1/2 10/30/21   [provider]  Vitamin D , Cholecalciferol, 25 MCG (1000 UT) TABS Take 1 capsule by mouth daily.    [provider]    Allergies: Anaprox [naproxen sodium]    Review of Systems  Genitourinary:   Positive for flank pain.    Updated Vital Signs BP (!) 162/107 (BP Location: Left Arm)   Pulse 99   Temp (!) 97.5 F (36.4 C) (Oral)   Resp 17   SpO2 100%   Physical Exam Vitals and nursing note reviewed.  Constitutional:      General: She is not in acute distress.    Appearance: She is well-developed. She is not diaphoretic.  HENT:     Head: Normocephalic and atraumatic.  Eyes:     Pupils: Pupils are equal, round, and reactive to light.  Cardiovascular:     Rate and Rhythm: Normal rate and regular rhythm.     Heart sounds: No murmur heard.    No friction rub. No gallop.  Pulmonary:     Effort: Pulmonary effort is normal.     Breath sounds: No wheezing or rales.  Abdominal:     General: There is no distension.     Palpations: Abdomen is soft.     Tenderness: There is no abdominal tenderness. There is no right CVA tenderness or left CVA tenderness.     Comments: There is a small amount of erythema about the right flank.  She has no obvious  CVA tenderness to percussion.  No abdominal discomfort.  No obvious midline spinal tenderness step-offs or deformities.  She has an old midline L-spine incision.  Musculoskeletal:        General: No tenderness.     Cervical back: Normal range of motion and neck supple.  Skin:    General: Skin is warm and dry.  Neurological:     Mental Status: She is alert and oriented to person, place, and time.  Psychiatric:        Behavior: Behavior normal.     (all labs ordered are listed, but only abnormal results are displayed) Labs Reviewed  URINALYSIS, ROUTINE W REFLEX MICROSCOPIC    EKG: None  Radiology: No results found.   Procedures   Medications Ordered in the ED - No data to display                                  Medical Decision Making Amount and/or Complexity of Data Reviewed Labs: ordered.  Risk Prescription drug management.   74 yo F with a chief complaint of right flank pain.  Going on just for an hour or 2.   Sounds musculoskeletal by history and physical.  She able to know yesterday and then had developed pain today.  Seems to be positional.  She has a urine sample here negative for infection or hematuria.  I did offer CT imaging.  She is currently declining.  4:40 PM:  I have discussed the diagnosis/risks/treatment options with the patient.  Evaluation and diagnostic testing in the emergency department does not suggest an emergent condition requiring admission or immediate intervention beyond what has been performed at this time.  They will follow up with PCP. We also discussed returning to the ED immediately if new or worsening sx occur. We discussed the sx which are most concerning (e.g., sudden worsening pain, fever, inability to tolerate by mouth, cauda equina s/sx) that necessitate immediate return. Medications administered to the patient during their visit and any new prescriptions provided to the patient are listed below.  Medications given during this visit Medications - No data to display   The patient appears reasonably screen and/or stabilized for discharge and I doubt any other medical condition or other [facility] requiring further screening, evaluation, or treatment in the ED at this time prior to discharge.       Final diagnoses:  Right flank pain    ED Discharge Orders          Ordered    methylPREDNISolone  (MEDROL  DOSEPAK) 4 MG TBPK tablet        07/24/24 1637               Emil Share, DO 07/24/24 1640  "

## 2024-07-24 NOTE — ED Triage Notes (Signed)
 R sided flank pain for 1 hour.   Denies urinary symptoms, NV or fevers

## 2024-07-25 NOTE — Telephone Encounter (Signed)
 After hours call  Documentation error through [facility]

## 2024-11-03 ENCOUNTER — Encounter: Admitting: Obstetrics and Gynecology

## 2024-11-07 ENCOUNTER — Encounter: Admitting: Obstetrics and Gynecology
